# Patient Record
Sex: Female | Born: 1955 | Hispanic: Yes | Marital: Married | Smoking: Current every day smoker
Health system: Southern US, Community
[De-identification: ages and names within clinical notes are randomized; demographics above are authoritative.]

## PROBLEM LIST (undated history)

## (undated) DIAGNOSIS — M1990 Unspecified osteoarthritis, unspecified site: Secondary | ICD-10-CM

## (undated) DIAGNOSIS — K219 Gastro-esophageal reflux disease without esophagitis: Secondary | ICD-10-CM

## (undated) HISTORY — PX: KNEE ARTHROSCOPY: SUR90

## (undated) HISTORY — PX: OTHER SURGICAL HISTORY: SHX169

## (undated) HISTORY — PX: ABDOMINAL HYSTERECTOMY: SHX81

---

## 2008-05-11 ENCOUNTER — Ambulatory Visit: Payer: Self-pay

## 2011-03-28 ENCOUNTER — Emergency Department: Payer: Self-pay

## 2011-03-28 LAB — CBC
HCT: 39 % (ref 35.0–47.0)
HGB: 13.3 g/dL (ref 12.0–16.0)
MCV: 90 fL (ref 80–100)
RBC: 4.32 10*6/uL (ref 3.80–5.20)
WBC: 7.2 10*3/uL (ref 3.6–11.0)

## 2011-03-28 LAB — BASIC METABOLIC PANEL
Anion Gap: 11 (ref 7–16)
BUN: 11 mg/dL (ref 7–18)
Chloride: 105 mmol/L (ref 98–107)
Creatinine: 0.48 mg/dL — ABNORMAL LOW (ref 0.60–1.30)
EGFR (African American): >60
EGFR (Non-African Amer.): >60
Glucose: 100 mg/dL — ABNORMAL HIGH (ref 65–99)
Osmolality: 286 (ref 275–301)
Sodium: 144 mmol/L (ref 136–145)

## 2011-03-28 LAB — TROPONIN I: Troponin-I: <0.02 ng/mL

## 2011-03-29 LAB — TROPONIN I: Troponin-I: <0.02 ng/mL

## 2011-04-09 ENCOUNTER — Emergency Department: Payer: Self-pay | Admitting: Emergency Medicine

## 2011-10-18 ENCOUNTER — Ambulatory Visit: Payer: Self-pay | Admitting: Family Medicine

## 2013-06-15 ENCOUNTER — Ambulatory Visit: Payer: Self-pay | Admitting: Nurse Practitioner

## 2015-01-21 DIAGNOSIS — E669 Obesity, unspecified: Secondary | ICD-10-CM | POA: Insufficient documentation

## 2015-01-21 DIAGNOSIS — F17200 Nicotine dependence, unspecified, uncomplicated: Secondary | ICD-10-CM | POA: Insufficient documentation

## 2015-01-21 DIAGNOSIS — E66812 Obesity, class 2: Secondary | ICD-10-CM | POA: Insufficient documentation

## 2015-01-24 ENCOUNTER — Other Ambulatory Visit: Payer: Self-pay | Admitting: Adult Health

## 2015-01-24 DIAGNOSIS — N644 Mastodynia: Secondary | ICD-10-CM

## 2015-02-10 ENCOUNTER — Other Ambulatory Visit: Payer: Self-pay

## 2015-02-10 ENCOUNTER — Ambulatory Visit: Payer: Self-pay | Attending: Family Medicine

## 2015-05-12 ENCOUNTER — Encounter: Payer: Self-pay | Admitting: *Deleted

## 2015-05-13 ENCOUNTER — Ambulatory Visit: Payer: Managed Care, Other (non HMO) | Admitting: Anesthesiology

## 2015-05-13 ENCOUNTER — Encounter
Admission: RE | Disposition: A | Discharge status: Home Health | Payer: Self-pay | Source: Ambulatory Visit | Attending: Unknown Physician Specialty

## 2015-05-13 ENCOUNTER — Ambulatory Visit
Admission: RE | Admit: 2015-05-13 | Discharge: 2015-05-13 | Disposition: A | Discharge status: Home Health | Payer: Managed Care, Other (non HMO) | Source: Ambulatory Visit | Attending: Unknown Physician Specialty | Admitting: Unknown Physician Specialty

## 2015-05-13 ENCOUNTER — Encounter: Payer: Self-pay | Admitting: Anesthesiology

## 2015-05-13 DIAGNOSIS — K219 Gastro-esophageal reflux disease without esophagitis: Secondary | ICD-10-CM | POA: Diagnosis not present

## 2015-05-13 DIAGNOSIS — K2950 Unspecified chronic gastritis without bleeding: Secondary | ICD-10-CM | POA: Insufficient documentation

## 2015-05-13 DIAGNOSIS — K640 First degree hemorrhoids: Secondary | ICD-10-CM | POA: Insufficient documentation

## 2015-05-13 DIAGNOSIS — F17210 Nicotine dependence, cigarettes, uncomplicated: Secondary | ICD-10-CM | POA: Insufficient documentation

## 2015-05-13 DIAGNOSIS — D122 Benign neoplasm of ascending colon: Secondary | ICD-10-CM | POA: Diagnosis not present

## 2015-05-13 DIAGNOSIS — Z79899 Other long term (current) drug therapy: Secondary | ICD-10-CM | POA: Diagnosis not present

## 2015-05-13 DIAGNOSIS — D127 Benign neoplasm of rectosigmoid junction: Secondary | ICD-10-CM | POA: Diagnosis not present

## 2015-05-13 DIAGNOSIS — K449 Diaphragmatic hernia without obstruction or gangrene: Secondary | ICD-10-CM | POA: Diagnosis not present

## 2015-05-13 DIAGNOSIS — Z1211 Encounter for screening for malignant neoplasm of colon: Secondary | ICD-10-CM | POA: Diagnosis not present

## 2015-05-13 DIAGNOSIS — R1013 Epigastric pain: Secondary | ICD-10-CM | POA: Diagnosis present

## 2015-05-13 HISTORY — PX: COLONOSCOPY WITH PROPOFOL: SHX5780

## 2015-05-13 HISTORY — DX: Gastro-esophageal reflux disease without esophagitis: K21.9

## 2015-05-13 HISTORY — PX: ESOPHAGOGASTRODUODENOSCOPY (EGD) WITH PROPOFOL: SHX5813

## 2015-05-13 SURGERY — COLONOSCOPY WITH PROPOFOL
Anesthesia: General

## 2015-05-13 MED ORDER — IPRATROPIUM-ALBUTEROL 0.5-2.5 (3) MG/3ML IN SOLN
3.0000 mL | Freq: Once | RESPIRATORY_TRACT | Status: AC
  Administered 2015-05-13: 3 mL via RESPIRATORY_TRACT

## 2015-05-13 MED ORDER — GLYCOPYRROLATE 0.2 MG/ML IJ SOLN
INTRAMUSCULAR | Status: DC | PRN
  Administered 2015-05-13: 0.2 mg via INTRAVENOUS

## 2015-05-13 MED ORDER — PROPOFOL 10 MG/ML IV BOLUS
INTRAVENOUS | Status: DC | PRN
  Administered 2015-05-13 (×3): 10 mg via INTRAVENOUS
  Administered 2015-05-13: 50 mg via INTRAVENOUS

## 2015-05-13 MED ORDER — MIDAZOLAM HCL 5 MG/5ML IJ SOLN
INTRAMUSCULAR | Status: DC | PRN
  Administered 2015-05-13: 1 mg via INTRAVENOUS

## 2015-05-13 MED ORDER — IPRATROPIUM-ALBUTEROL 0.5-2.5 (3) MG/3ML IN SOLN
RESPIRATORY_TRACT | Status: AC
  Filled 2015-05-13: qty 3

## 2015-05-13 MED ORDER — PHENYLEPHRINE HCL 10 MG/ML IJ SOLN
INTRAMUSCULAR | Status: DC | PRN
  Administered 2015-05-13 (×3): 200 ug via INTRAVENOUS
  Administered 2015-05-13: 100 ug via INTRAVENOUS

## 2015-05-13 MED ORDER — FENTANYL CITRATE (PF) 100 MCG/2ML IJ SOLN
INTRAMUSCULAR | Status: DC | PRN
  Administered 2015-05-13: 50 ug via INTRAVENOUS

## 2015-05-13 MED ORDER — SODIUM CHLORIDE 0.9 % IV SOLN
INTRAVENOUS | Status: DC
  Administered 2015-05-13: 1000 mL via INTRAVENOUS

## 2015-05-13 MED ORDER — LIDOCAINE HCL (PF) 2 % IJ SOLN
INTRAMUSCULAR | Status: DC | PRN
  Administered 2015-05-13: 60 mg

## 2015-05-13 MED ORDER — PROPOFOL 500 MG/50ML IV EMUL
INTRAVENOUS | Status: DC | PRN
  Administered 2015-05-13: 100 ug/kg/min via INTRAVENOUS

## 2015-05-13 MED ORDER — SODIUM CHLORIDE 0.9 % IV SOLN
INTRAVENOUS | Status: DC
  Administered 2015-05-13: 13:00:00 via INTRAVENOUS

## 2015-05-13 NOTE — Op Note (Signed)
[facility] Gastroenterology Patient Name: Laurie Gonzalez Procedure Date: 05/13/2015 1:12 PM MRN: [id number] Account #: [id number] Date of Birth: April 29, 1955 Admit Type: Outpatient Age: 60 Room: [facility] ENDO ROOM 1 Gender: Female Note Status: Finalized Procedure:            Upper GI endoscopy Indications:          Epigastric abdominal pain Providers:            Manya Silvas, MD Medicines:            Propofol per Anesthesia Complications:        No immediate complications. Procedure:            Pre-Anesthesia Assessment:                       - After reviewing the risks and benefits, the patient                        was deemed in satisfactory condition to undergo the                        procedure.                       After obtaining informed consent, the endoscope was                        passed under direct vision. Throughout the procedure,                        the patient's blood pressure, pulse, and oxygen                        saturations were monitored continuously. The Endoscope                        was introduced through the mouth, and advanced to the                        second part of duodenum. The upper GI endoscopy was                        accomplished without difficulty. The patient tolerated                        the procedure well. Findings:      The examined esophagus was normal. GEJ 37cm.      Localized moderate inflammation characterized by congestion (edema) and       erythema was found on the lesser curvature of the gastric antrum.       Biopsies were taken with a cold forceps for histology. Biopsies were       taken with a cold forceps for Helicobacter pylori testing.      The examined duodenum was normal. Impression:           - Normal esophagus.                       - Gastritis. Biopsied.                       - Normal examined duodenum. Recommendation:       -  Await pathology results. Manya Silvas, MD 05/13/2015  1:25:14 PM This report has been signed electronically. Number of Addenda: 0 Note Initiated On: 05/13/2015 1:12 PM      [facility]

## 2015-05-13 NOTE — Transfer of Care (Signed)
Immediate Anesthesia Transfer of Care Note  Patient: Laurie Gonzalez  Procedure(s) Performed: Procedure(s): COLONOSCOPY WITH PROPOFOL (N/A) ESOPHAGOGASTRODUODENOSCOPY (EGD) WITH PROPOFOL (N/A)  Patient Location: PACU  Anesthesia Type:General  Level of Consciousness: sedated  Airway & Oxygen Therapy: Patient Spontanous Breathing and Patient connected to nasal cannula oxygen  Post-op Assessment: Report given to RN and Post -op Vital signs reviewed and stable  Post vital signs: Reviewed and stable  Last Vitals:  Filed Vitals:   05/13/15 1214  BP: 108/60  Pulse: 92  Temp: 37.1 C  Resp: 16    Complications: No apparent anesthesia complications

## 2015-05-13 NOTE — Anesthesia Postprocedure Evaluation (Signed)
Anesthesia Post Note  Patient: Laurie Gonzalez  Procedure(s) Performed: Procedure(s) (LRB): COLONOSCOPY WITH PROPOFOL (N/A) ESOPHAGOGASTRODUODENOSCOPY (EGD) WITH PROPOFOL (N/A)  Patient location during evaluation: Endoscopy Anesthesia Type: General Level of consciousness: awake and alert Pain management: pain level controlled Vital Signs Assessment: post-procedure vital signs reviewed and stable Respiratory status: spontaneous breathing, nonlabored ventilation, respiratory function stable and patient connected to nasal cannula oxygen Cardiovascular status: blood pressure returned to baseline and stable Postop Assessment: no signs of nausea or vomiting Anesthetic complications: no    Last Vitals:  Filed Vitals:   05/13/15 1416 05/13/15 1426  BP: 96/50 98/55  Pulse: 71 72  Temp:    Resp: 12 10    Last Pain: There were no vitals filed for this visit.               Precious Haws [name]

## 2015-05-13 NOTE — Op Note (Signed)
[facility] Gastroenterology Patient Name: Laurie Gonzalez Procedure Date: 05/13/2015 1:25 PM MRN: [id number] Account #: [id number] Date of Birth: 1956/03/12 Admit Type: Outpatient Age: 60 Room: [facility] ENDO ROOM 1 Gender: Female Note Status: Finalized Procedure:            Colonoscopy Indications:          Screening for colorectal malignant neoplasm Providers:            Manya Silvas, MD Medicines:            Propofol per Anesthesia Complications:        No immediate complications. Procedure:            Pre-Anesthesia Assessment:                       - After reviewing the risks and benefits, the patient                        was deemed in satisfactory condition to undergo the                        procedure.                       After obtaining informed consent, the colonoscope was                        passed under direct vision. Throughout the procedure,                        the patient's blood pressure, pulse, and oxygen                        saturations were monitored continuously. The                        Colonoscope was introduced through the anus and                        advanced to the the cecum, identified by appendiceal                        orifice and ileocecal valve. The colonoscopy was                        performed without difficulty. The patient tolerated the                        procedure well. The quality of the bowel preparation                        was excellent. Findings:      A diminutive polyp was found in the ascending colon. The polyp was       sessile. The polyp was removed with a jumbo cold forceps. Resection and       retrieval were complete.      Two sessile polyps were found in the recto-sigmoid colon. The polyps       were diminutive in size. These polyps were removed with a cold biopsy       forceps. Resection and retrieval were complete.      Internal hemorrhoids  were found during endoscopy. The hemorrhoids were        small and Grade I (internal hemorrhoids that do not prolapse).      The exam was otherwise without abnormality. Impression:           - One diminutive polyp in the ascending colon, removed                        with a jumbo cold forceps. Resected and retrieved.                       - Two diminutive polyps at the recto-sigmoid colon,                        removed with a cold biopsy forceps. Resected and                        retrieved.                       - Internal hemorrhoids.                       - The examination was otherwise normal. Recommendation:       - Await pathology results. Manya Silvas, MD 05/13/2015 1:44:58 PM This report has been signed electronically. Number of Addenda: 0 Note Initiated On: 05/13/2015 1:25 PM Scope Withdrawal Time: 0 hours 11 minutes 42 seconds  Total Procedure Duration: 0 hours 16 minutes 48 seconds       [facility]

## 2015-05-13 NOTE — Anesthesia Preprocedure Evaluation (Signed)
Anesthesia Evaluation  Patient identified by MRN, date of birth, ID band Patient awake    Reviewed: Allergy & Precautions, H&P , NPO status , Patient's Chart, lab work & pertinent test results  History of Anesthesia Complications Negative for: history of anesthetic complications  Airway Mallampati: III  TM Distance: >3 FB Neck ROM: limited    Dental  (+) Poor Dentition, Chipped, Caps   Pulmonary neg shortness of breath, Current Smoker,    Pulmonary exam normal breath sounds clear to auscultation       Cardiovascular Exercise Tolerance: Good (-) angina(-) Past MI and (-) DOE negative cardio ROS Normal cardiovascular exam Rhythm:regular Rate:Normal     Neuro/Psych negative neurological ROS  negative psych ROS   GI/Hepatic Neg liver ROS, GERD  Controlled,  Endo/Other  negative endocrine ROS  Renal/GU negative Renal ROS  negative genitourinary   Musculoskeletal   Abdominal   Peds  Hematology negative hematology ROS (+)   Anesthesia Other Findings Past Medical History:   GERD (gastroesophageal reflux disease)                      Past Surgical History:   ABDOMINAL HYSTERECTOMY                                        KNEE ARTHROSCOPY                                             BMI    Body Mass Index   31.61 kg/m 2      Reproductive/Obstetrics negative OB ROS                             Anesthesia Physical Anesthesia Plan  ASA: III  Anesthesia Plan: General   Post-op Pain Management:    Induction:   Airway Management Planned:   Additional Equipment:   Intra-op Plan:   Post-operative Plan:   Informed Consent: I have reviewed the patients History and Physical, chart, labs and discussed the procedure including the risks, benefits and alternatives for the proposed anesthesia with the patient or authorized representative who has indicated his/her understanding and acceptance.    Dental Advisory Given  Plan Discussed with: Anesthesiologist, CRNA and Surgeon  Anesthesia Plan Comments:         Anesthesia Quick Evaluation

## 2015-05-13 NOTE — H&P (Signed)
   Primary Care Physician:  Pcp Not In System Primary Gastroenterologist:  Dr. Vira Agar  Pre-Procedure History & Physical: HPI:  Laurie Gonzalez is a 60 y.o. female is here for an endoscopy and colonoscopy.   Past Medical History  Diagnosis Date  . GERD (gastroesophageal reflux disease)     Past Surgical History  Procedure Laterality Date  . Abdominal hysterectomy    . Knee arthroscopy      Prior to Admission medications   Medication Sig Start Date End Date Taking? Authorizing Provider  bismuth subsalicylate (PEPTO BISMOL) 262 MG chewable tablet Chew 524 mg by mouth as needed.   Yes Historical Provider, MD  pantoprazole (PROTONIX) 40 MG tablet Take 40 mg by mouth daily.   Yes Historical Provider, MD    Allergies as of 05/12/2015  . (Not on File)    History reviewed. No pertinent family history.  Social History   Social History  . Marital Status: Single    Spouse Name: N/A  . Number of Children: N/A  . Years of Education: N/A   Occupational History  . Not on file.   Social History Main Topics  . Smoking status: Current Every Day Smoker -- 0.25 packs/day    Types: Cigarettes  . Smokeless tobacco: Never Used  . Alcohol Use: Yes  . Drug Use: No  . Sexual Activity: Not on file   Other Topics Concern  . Not on file   Social History Narrative    Review of Systems: See HPI, otherwise negative ROS  Physical Exam: BP 108/60 mmHg  Pulse 92  Temp(Src) 98.8 F (37.1 C) (Tympanic)  Resp 16  Ht 5\' 5"  (1.651 m)  Wt 86.183 kg (190 lb)  BMI 31.62 kg/m2  SpO2 96% General:   Alert,  pleasant and cooperative in NAD Head:  Normocephalic and atraumatic. Neck:  Supple; no masses or thyromegaly. Lungs:  Clear throughout to auscultation.    Heart:  Regular rate and rhythm. Abdomen:  Soft, nontender and nondistended. Normal bowel sounds, without guarding, and without rebound.   Neurologic:  Alert and  oriented x4;  grossly normal neurologically.  Impression/Plan: Laurie Gonzalez is here for an endoscopy and colonoscopy to be performed for screening colon exam and epigastric pain and GERD.  Risks, benefits, limitations, and alternatives regarding  endoscopy and colonoscopy have been reviewed with the patient.  Questions have been answered.  All parties agreeable.   Gaylyn Cheers, MD  05/13/2015, 12:25 PM

## 2015-05-13 NOTE — OR Nursing (Signed)
All Pre-op done with assistance of [facility] interpreter, Lacretia Leigh.  Norlene Campbell, RN

## 2015-05-16 ENCOUNTER — Encounter: Payer: Self-pay | Admitting: Unknown Physician Specialty

## 2015-05-17 LAB — SURGICAL PATHOLOGY

## 2016-09-19 ENCOUNTER — Other Ambulatory Visit: Payer: Self-pay | Admitting: Family Medicine

## 2016-09-19 DIAGNOSIS — M25561 Pain in right knee: Secondary | ICD-10-CM

## 2016-09-20 ENCOUNTER — Ambulatory Visit
Admission: RE | Admit: 2016-09-20 | Discharge: 2016-09-20 | Disposition: A | Discharge status: Home / Self Care | Payer: Commercial Managed Care - PPO | Source: Ambulatory Visit | Attending: Family Medicine | Admitting: Family Medicine

## 2016-09-20 DIAGNOSIS — M7051 Other bursitis of knee, right knee: Secondary | ICD-10-CM | POA: Insufficient documentation

## 2016-09-20 DIAGNOSIS — M25561 Pain in right knee: Secondary | ICD-10-CM

## 2016-09-20 DIAGNOSIS — M7121 Synovial cyst of popliteal space [Baker], right knee: Secondary | ICD-10-CM | POA: Diagnosis not present

## 2016-09-20 DIAGNOSIS — M25461 Effusion, right knee: Secondary | ICD-10-CM | POA: Insufficient documentation

## 2016-09-20 DIAGNOSIS — X58XXXA Exposure to other specified factors, initial encounter: Secondary | ICD-10-CM | POA: Diagnosis not present

## 2016-09-20 DIAGNOSIS — S83231A Complex tear of medial meniscus, current injury, right knee, initial encounter: Secondary | ICD-10-CM | POA: Insufficient documentation

## 2016-09-20 DIAGNOSIS — S83241A Other tear of medial meniscus, current injury, right knee, initial encounter: Secondary | ICD-10-CM | POA: Insufficient documentation

## 2016-09-20 DIAGNOSIS — M1711 Unilateral primary osteoarthritis, right knee: Secondary | ICD-10-CM | POA: Insufficient documentation

## 2017-05-07 ENCOUNTER — Other Ambulatory Visit: Payer: Self-pay | Admitting: Internal Medicine

## 2017-05-07 DIAGNOSIS — G8929 Other chronic pain: Secondary | ICD-10-CM

## 2017-05-07 DIAGNOSIS — Z1239 Encounter for other screening for malignant neoplasm of breast: Secondary | ICD-10-CM

## 2017-05-07 DIAGNOSIS — M5442 Lumbago with sciatica, left side: Principal | ICD-10-CM

## 2017-06-07 ENCOUNTER — Ambulatory Visit
Admission: RE | Admit: 2017-06-07 | Discharge: 2017-06-07 | Disposition: A | Discharge status: Home / Self Care | Payer: Commercial Managed Care - PPO | Source: Ambulatory Visit | Attending: Internal Medicine | Admitting: Internal Medicine

## 2017-06-07 DIAGNOSIS — M5442 Lumbago with sciatica, left side: Secondary | ICD-10-CM | POA: Diagnosis present

## 2017-06-07 DIAGNOSIS — M48061 Spinal stenosis, lumbar region without neurogenic claudication: Secondary | ICD-10-CM | POA: Insufficient documentation

## 2017-06-07 DIAGNOSIS — M5116 Intervertebral disc disorders with radiculopathy, lumbar region: Secondary | ICD-10-CM | POA: Insufficient documentation

## 2017-06-07 DIAGNOSIS — G8929 Other chronic pain: Secondary | ICD-10-CM | POA: Diagnosis not present

## 2017-06-07 MED ORDER — GADOBENATE DIMEGLUMINE 529 MG/ML IV SOLN
20.0000 mL | Freq: Once | INTRAVENOUS | Status: AC | PRN
  Administered 2017-06-07: 18 mL via INTRAVENOUS

## 2017-06-25 ENCOUNTER — Encounter: Payer: Self-pay | Admitting: *Deleted

## 2017-06-25 ENCOUNTER — Other Ambulatory Visit: Payer: Self-pay

## 2017-06-26 ENCOUNTER — Ambulatory Visit: Payer: Commercial Managed Care - PPO | Admitting: Anesthesiology

## 2017-06-26 ENCOUNTER — Encounter
Admission: RE | Disposition: A | Discharge status: Home / Self Care | Payer: Self-pay | Source: Ambulatory Visit | Attending: Surgery

## 2017-06-26 ENCOUNTER — Ambulatory Visit
Admission: RE | Admit: 2017-06-26 | Discharge: 2017-06-26 | Disposition: A | Discharge status: Home / Self Care | Payer: Commercial Managed Care - PPO | Source: Ambulatory Visit | Attending: Surgery | Admitting: Surgery

## 2017-06-26 DIAGNOSIS — M23221 Derangement of posterior horn of medial meniscus due to old tear or injury, right knee: Secondary | ICD-10-CM | POA: Insufficient documentation

## 2017-06-26 DIAGNOSIS — Z801 Family history of malignant neoplasm of trachea, bronchus and lung: Secondary | ICD-10-CM | POA: Insufficient documentation

## 2017-06-26 DIAGNOSIS — Z800 Family history of malignant neoplasm of digestive organs: Secondary | ICD-10-CM | POA: Insufficient documentation

## 2017-06-26 DIAGNOSIS — M1711 Unilateral primary osteoarthritis, right knee: Secondary | ICD-10-CM | POA: Diagnosis not present

## 2017-06-26 DIAGNOSIS — Z833 Family history of diabetes mellitus: Secondary | ICD-10-CM | POA: Diagnosis not present

## 2017-06-26 DIAGNOSIS — F17200 Nicotine dependence, unspecified, uncomplicated: Secondary | ICD-10-CM | POA: Diagnosis not present

## 2017-06-26 DIAGNOSIS — S83241A Other tear of medial meniscus, current injury, right knee, initial encounter: Secondary | ICD-10-CM | POA: Diagnosis present

## 2017-06-26 DIAGNOSIS — Z79899 Other long term (current) drug therapy: Secondary | ICD-10-CM | POA: Diagnosis not present

## 2017-06-26 DIAGNOSIS — Z90710 Acquired absence of both cervix and uterus: Secondary | ICD-10-CM | POA: Diagnosis not present

## 2017-06-26 DIAGNOSIS — M23261 Derangement of other lateral meniscus due to old tear or injury, right knee: Secondary | ICD-10-CM | POA: Diagnosis not present

## 2017-06-26 HISTORY — DX: Unspecified osteoarthritis, unspecified site: M19.90

## 2017-06-26 HISTORY — PX: KNEE ARTHROSCOPY: SHX127

## 2017-06-26 SURGERY — ARTHROSCOPY, KNEE
Anesthesia: General | Site: Knee | Laterality: Right | Wound class: Clean

## 2017-06-26 MED ORDER — PROPOFOL 10 MG/ML IV BOLUS
INTRAVENOUS | Status: DC | PRN
  Administered 2017-06-26: 130 mg via INTRAVENOUS

## 2017-06-26 MED ORDER — OXYCODONE HCL 5 MG PO TABS
5.0000 mg | ORAL_TABLET | Freq: Once | ORAL | Status: AC | PRN
  Administered 2017-06-26: 5 mg via ORAL

## 2017-06-26 MED ORDER — IBUPROFEN 100 MG/5ML PO SUSP: 800.0000 mg | Freq: Once | ORAL | Status: DC | PRN

## 2017-06-26 MED ORDER — LIDOCAINE HCL (CARDIAC) PF 100 MG/5ML IV SOSY
PREFILLED_SYRINGE | INTRAVENOUS | Status: DC | PRN
  Administered 2017-06-26: 40 mg via INTRATRACHEAL

## 2017-06-26 MED ORDER — LIDOCAINE-EPINEPHRINE 1 %-1:100000 IJ SOLN
INTRAMUSCULAR | Status: DC | PRN
  Administered 2017-06-26 (×2): 20 mL

## 2017-06-26 MED ORDER — GLYCOPYRROLATE 0.2 MG/ML IJ SOLN
INTRAMUSCULAR | Status: DC | PRN
  Administered 2017-06-26: .1 mg via INTRAVENOUS

## 2017-06-26 MED ORDER — MIDAZOLAM HCL 5 MG/5ML IJ SOLN
INTRAMUSCULAR | Status: DC | PRN
  Administered 2017-06-26: 2 mg via INTRAVENOUS

## 2017-06-26 MED ORDER — DEXAMETHASONE SODIUM PHOSPHATE 4 MG/ML IJ SOLN
INTRAMUSCULAR | Status: DC | PRN
  Administered 2017-06-26: 4 mg via INTRAVENOUS

## 2017-06-26 MED ORDER — BUPIVACAINE HCL (PF) 0.5 % IJ SOLN
INTRAMUSCULAR | Status: DC | PRN
  Administered 2017-06-26: 20 mL

## 2017-06-26 MED ORDER — FENTANYL CITRATE (PF) 100 MCG/2ML IJ SOLN: 25.0000 ug | INTRAMUSCULAR | Status: DC | PRN

## 2017-06-26 MED ORDER — FENTANYL CITRATE (PF) 100 MCG/2ML IJ SOLN
INTRAMUSCULAR | Status: DC | PRN
  Administered 2017-06-26: 50 ug via INTRAVENOUS
  Administered 2017-06-26 (×2): 25 ug via INTRAVENOUS

## 2017-06-26 MED ORDER — HYDROCODONE-ACETAMINOPHEN 5-325 MG PO TABS: 1.0000 | ORAL_TABLET | Freq: Four times a day (QID) | ORAL | 0 refills | Status: DC | PRN

## 2017-06-26 MED ORDER — CEFAZOLIN SODIUM-DEXTROSE 2-4 GM/100ML-% IV SOLN
2.0000 g | Freq: Once | INTRAVENOUS | Status: AC
  Administered 2017-06-26: 2 g via INTRAVENOUS

## 2017-06-26 MED ORDER — LACTATED RINGERS IV SOLN
INTRAVENOUS | Status: DC | PRN
  Administered 2017-06-26: 12:00:00 via INTRAVENOUS

## 2017-06-26 MED ORDER — OXYCODONE HCL 5 MG/5ML PO SOLN: 5.0000 mg | Freq: Once | ORAL | Status: AC | PRN

## 2017-06-26 MED ORDER — ONDANSETRON HCL 4 MG/2ML IJ SOLN
INTRAMUSCULAR | Status: DC | PRN
  Administered 2017-06-26: 4 mg via INTRAVENOUS

## 2017-06-26 SURGICAL SUPPLY — 23 items
BANDAGE ELASTIC 6 LF NS (GAUZE/BANDAGES/DRESSINGS) ×3 IMPLANT
BLADE FULL RADIUS 3.5 (BLADE) ×3 IMPLANT
CHLORAPREP W/TINT 26ML (MISCELLANEOUS) ×3 IMPLANT
COVER LIGHT HANDLE UNIVERSAL (MISCELLANEOUS) ×6 IMPLANT
CUFF TOURN SGL QUICK 30 (MISCELLANEOUS) ×2
CUFF TRNQT CYL LO 30X4X (MISCELLANEOUS) ×1 IMPLANT
DRAPE IMP U-DRAPE 54X76 (DRAPES) ×3 IMPLANT
GAUZE SPONGE 4X4 12PLY STRL (GAUZE/BANDAGES/DRESSINGS) ×3 IMPLANT
GLOVE BIO SURGEON STRL SZ8 (GLOVE) ×6 IMPLANT
GLOVE INDICATOR 8.0 STRL GRN (GLOVE) ×3 IMPLANT
GOWN STRL REUS W/ TWL LRG LVL3 (GOWN DISPOSABLE) ×1 IMPLANT
GOWN STRL REUS W/ TWL XL LVL3 (GOWN DISPOSABLE) ×1 IMPLANT
GOWN STRL REUS W/TWL LRG LVL3 (GOWN DISPOSABLE) ×2
GOWN STRL REUS W/TWL XL LVL3 (GOWN DISPOSABLE) ×2
IV LACTATED RINGER IRRG 3000ML (IV SOLUTION) ×4
IV LR IRRIG 3000ML ARTHROMATIC (IV SOLUTION) ×2 IMPLANT
KIT TURNOVER KIT A (KITS) ×3 IMPLANT
MANIFOLD 4PT FOR NEPTUNE1 (MISCELLANEOUS) ×3 IMPLANT
NEEDLE HYPO 21X1.5 SAFETY (NEEDLE) ×6 IMPLANT
PACK ARTHROSCOPY KNEE (MISCELLANEOUS) ×3 IMPLANT
SUT PROLENE 4 0 PS 2 18 (SUTURE) ×3 IMPLANT
SYR 50ML LL SCALE MARK (SYRINGE) ×3 IMPLANT
TUBING ARTHRO INFLOW-ONLY STRL (TUBING) ×3 IMPLANT

## 2017-06-26 NOTE — Anesthesia Preprocedure Evaluation (Signed)
Anesthesia Evaluation  Patient identified by MRN, date of birth, ID band Patient awake    Reviewed: Allergy & Precautions, H&P , NPO status , Patient's Chart, lab work & pertinent test results  History of Anesthesia Complications Negative for: history of anesthetic complications  Airway Mallampati: II  TM Distance: >3 FB Neck ROM: full    Dental no notable dental hx.    Pulmonary Current Smoker,    Pulmonary exam normal        Cardiovascular negative cardio ROS Normal cardiovascular exam     Neuro/Psych    GI/Hepatic Neg liver ROS, Medicated,  Endo/Other  negative endocrine ROS  Renal/GU negative Renal ROS     Musculoskeletal   Abdominal   Peds  Hematology negative hematology ROS (+)   Anesthesia Other Findings   Reproductive/Obstetrics                             Anesthesia Physical Anesthesia Plan  ASA: I  Anesthesia Plan: General LMA   Post-op Pain Management:    Induction:   PONV Risk Score and Plan:   Airway Management Planned:   Additional Equipment:   Intra-op Plan:   Post-operative Plan:   Informed Consent: I have reviewed the patients History and Physical, chart, labs and discussed the procedure including the risks, benefits and alternatives for the proposed anesthesia with the patient or authorized representative who has indicated his/her understanding and acceptance.     Plan Discussed with:   Anesthesia Plan Comments:         Anesthesia Quick Evaluation

## 2017-06-26 NOTE — Transfer of Care (Signed)
Immediate Anesthesia Transfer of Care Note  Patient: Laurie Gonzalez  Procedure(s) Performed: ARTHROSCOPY KNEE WITH DEBRIDEMENT AND REPAIR VS PARTIAL MEDIAL MENISCECTOMY (Right Knee)  Patient Location: PACU  Anesthesia Type: General LMA  Level of Consciousness: awake, alert  and patient cooperative  Airway and Oxygen Therapy: Patient Spontanous Breathing and Patient connected to supplemental oxygen  Post-op Assessment: Post-op Vital signs reviewed, Patient's Cardiovascular Status Stable, Respiratory Function Stable, Patent Airway and No signs of Nausea or vomiting  Post-op Vital Signs: Reviewed and stable  Complications: No apparent anesthesia complications

## 2017-06-26 NOTE — H&P (Signed)
Paper H&P to be scanned into permanent record. H&P reviewed and patient re-examined. No changes. 

## 2017-06-26 NOTE — Op Note (Signed)
06/26/2017  12:51 PM  Patient:   Laurie Gonzalez  Pre-Op Diagnosis:   Medial meniscus tear with underlying degenerative joint disease, right knee.  Postoperative diagnosis:   Medial and lateral meniscal tears with underlying degenerative joint disease, right knee.  Procedure:   Arthroscopic debridement with partial medial and lateral meniscectomies, right knee.  Surgeon:   Pascal Lux, M.D.  Anesthesia:   General LMA.  Findings:   As above. There was a complex tear with an unstable flap component involving the posterior medial portion of the medial meniscus, as well as a small horizontal cleavage tear involving the midportion of the lateral meniscus, again with a small unstable inferior flap tear. There were focal grade 3 chondromalacial changes involving the medial edge of the medial femoral condyle as well as the medial margin of the medial tibial plateau. The remainder of the articular surfaces were in satisfactory condition. The anterior and posterior crucial ligaments both were in satisfactory condition as well.  Complications:   None.  EBL:   <5 cc.  Total fluids:   700 cc of crystalloid.  Tourniquet time:   None  Drains:   None  Closure:   4-0 Prolene interrupted sutures.  Brief clinical note:   The patient is a 62 year old female with a nearly 1 year history of medial sided right knee pain. The patient's symptoms have persisted despite medications, activity modification, and injection, etc. Her history and examination are consistent with a medial meniscus tear which was confirmed by MRI scan. The patient presents at this time for arthroscopy, debridement, and partial medial meniscectomy.  Procedure:   The patient was brought into the operating room and lain in the supine position. After adequate general laryngeal mask anesthesia was obtained, a timeout was performed to verify the appropriate side. The patient's right knee was injected sterilely using a solution of 30 cc of 1%  lidocaine and 30 cc of 0.5% Sensorcaine with epinephrine. The right lower extremity was prepped with ChloraPrep solution before being draped sterilely. Preoperative antibiotics were administered. The expected portal sites were injected with 0.5% Sensorcaine with epinephrine before the camera was placed in the anterolateral portal and instrumentation performed through the anteromedial portal. The knee was sequentially examined beginning in the suprapatellar pouch, then progressing to the patellofemoral space, the medial gutter compartment, the notch, and finally the lateral compartment and gutter. The findings were as described above. Abundant reactive synovial tissues anteriorly were debrided using the full-radius resector in order to improve visualization. The complex degenerative tear involving the anterior and posterior medial aspects of the medial meniscus were debrided back to stable margins using combination of the straight and angled any monitors, as well as the full-radius resector. The camera was repositioned in the anteromedial portal and the instruments inserted through the anterolateral portal in order to better access the anterior most portion of the tear. The lateral meniscus tear was debrided back to stable margins using combination of the straight baskets and full-radius resector. Subsequent probing of the remaining medial and lateral meniscal rims demonstrated excellent stability. The instruments were removed from the joint after suctioning the excess fluid. The portal sites were closed using 4-0 Prolene interrupted sutures before a sterile bulky dressing was applied to the knee. The patient was then awakened, extubated, and returned to the recovery room in satisfactory condition after tolerating the procedure well.

## 2017-06-26 NOTE — Discharge Instructions (Signed)
General Anesthesia, Adult, Care After °These instructions provide you with information about caring for yourself after your procedure. Your health care provider may also give you more specific instructions. Your treatment has been planned according to current medical practices, but problems sometimes occur. Call your health care provider if you have any problems or questions after your procedure. °What can I expect after the procedure? °After the procedure, it is common to have: °· Vomiting. °· A sore throat. °· Mental slowness. ° °It is common to feel: °· Nauseous. °· Cold or shivery. °· Sleepy. °· Tired. °· Sore or achy, even in parts of your body where you did not have surgery. ° °Follow these instructions at home: °For at least 24 hours after the procedure: °· Do not: °? Participate in activities where you could fall or become injured. °? Drive. °? Use heavy machinery. °? Drink alcohol. °? Take sleeping pills or medicines that cause drowsiness. °? Make important decisions or sign legal documents. °? Take care of children on your own. °· Rest. °Eating and drinking °· If you vomit, drink water, juice, or soup when you can drink without vomiting. °· Drink enough fluid to keep your urine clear or pale yellow. °· Make sure you have little or no nausea before eating solid foods. °· Follow the diet recommended by your health care provider. °General instructions °· Have a responsible adult stay with you until you are awake and alert. °· Return to your normal activities as told by your health care provider. Ask your health care provider what activities are safe for you. °· Take over-the-counter and prescription medicines only as told by your health care provider. °· If you smoke, do not smoke without supervision. °· Keep all follow-up visits as told by your health care provider. This is important. °Contact a health care provider if: °· You continue to have nausea or vomiting at home, and medicines are not helpful. °· You  cannot drink fluids or start eating again. °· You cannot urinate after 8-12 hours. °· You develop a skin rash. °· You have fever. °· You have increasing redness at the site of your procedure. °Get help right away if: °· You have difficulty breathing. °· You have chest pain. °· You have unexpected bleeding. °· You feel that you are having a life-threatening or urgent problem. °This information is not intended to replace advice given to you by your health care provider. Make sure you discuss any questions you have with your health care provider. °Document Released: 06/04/2000 Document Revised: 08/01/2015 Document Reviewed: 02/10/2015 °Elsevier Interactive Patient Education © 2018 [employer] ° ° °Orthopedic discharge instructions: °Keep dressing dry and intact.  °May shower after dressing changed on post-op day #4 (Sunday).  °Cover sutures with Band-Aids after drying off. °Apply ice frequently to knee. °Take ibuprofen 600-800 mg TID with meals for 7-10 days, then as necessary. °Take pain medication as prescribed or ES Tylenol when needed.  °May weight-bear as tolerated - use crutches or walker as needed. °Follow-up in 10-14 days or as scheduled. °

## 2017-06-26 NOTE — Anesthesia Procedure Notes (Signed)
Procedure Name: LMA Insertion Date/Time: 06/26/2017 11:56 AM Performed by: Mayme Genta, CRNA Pre-anesthesia Checklist: Patient identified, Emergency Drugs available, Suction available, Timeout performed and Patient being monitored Patient Re-evaluated:Patient Re-evaluated prior to induction Oxygen Delivery Method: Circle system utilized Preoxygenation: Pre-oxygenation with 100% oxygen Induction Type: IV induction LMA: LMA inserted LMA Size: 4.0 Number of attempts: 1 Placement Confirmation: positive ETCO2 and breath sounds checked- equal and bilateral Tube secured with: Tape

## 2017-06-26 NOTE — Anesthesia Postprocedure Evaluation (Signed)
Anesthesia Post Note  Patient: Laurie Gonzalez  Procedure(s) Performed: ARTHROSCOPY KNEE WITH DEBRIDEMENT AND REPAIR VS PARTIAL MEDIAL MENISCECTOMY (Right Knee)  Patient location during evaluation: PACU Anesthesia Type: General Level of consciousness: awake and alert Pain management: pain level controlled Vital Signs Assessment: post-procedure vital signs reviewed and stable Respiratory status: spontaneous breathing Cardiovascular status: blood pressure returned to baseline Postop Assessment: no headache Anesthetic complications: no    Jaci Standard, III,  Cionna Collantes D

## 2017-06-27 ENCOUNTER — Encounter: Payer: Self-pay | Admitting: Surgery

## 2017-06-27 DIAGNOSIS — S83271A Complex tear of lateral meniscus, current injury, right knee, initial encounter: Secondary | ICD-10-CM | POA: Insufficient documentation

## 2017-07-10 ENCOUNTER — Ambulatory Visit (HOSPITAL_BASED_OUTPATIENT_CLINIC_OR_DEPARTMENT_OTHER): Payer: Commercial Managed Care - PPO | Admitting: Student in an Organized Health Care Education/Training Program

## 2017-07-10 ENCOUNTER — Ambulatory Visit
Admission: RE | Admit: 2017-07-10 | Discharge: 2017-07-10 | Disposition: A | Discharge status: Home / Self Care | Payer: Commercial Managed Care - PPO | Source: Ambulatory Visit | Attending: Student in an Organized Health Care Education/Training Program | Admitting: Student in an Organized Health Care Education/Training Program

## 2017-07-10 ENCOUNTER — Other Ambulatory Visit: Payer: Self-pay

## 2017-07-10 ENCOUNTER — Encounter: Payer: Self-pay | Admitting: Student in an Organized Health Care Education/Training Program

## 2017-07-10 VITALS — BP 122/60 | HR 84 | Temp 97.8°F | Resp 19 | Ht 62.0 in | Wt 185.0 lb

## 2017-07-10 DIAGNOSIS — M5116 Intervertebral disc disorders with radiculopathy, lumbar region: Secondary | ICD-10-CM | POA: Diagnosis present

## 2017-07-10 DIAGNOSIS — M48061 Spinal stenosis, lumbar region without neurogenic claudication: Secondary | ICD-10-CM | POA: Diagnosis not present

## 2017-07-10 DIAGNOSIS — F17210 Nicotine dependence, cigarettes, uncomplicated: Secondary | ICD-10-CM | POA: Diagnosis not present

## 2017-07-10 DIAGNOSIS — K219 Gastro-esophageal reflux disease without esophagitis: Secondary | ICD-10-CM | POA: Insufficient documentation

## 2017-07-10 DIAGNOSIS — M1990 Unspecified osteoarthritis, unspecified site: Secondary | ICD-10-CM | POA: Insufficient documentation

## 2017-07-10 DIAGNOSIS — M5416 Radiculopathy, lumbar region: Secondary | ICD-10-CM

## 2017-07-10 MED ORDER — LIDOCAINE HCL (PF) 1 % IJ SOLN
4.5000 mL | Freq: Once | INTRAMUSCULAR | Status: AC
  Administered 2017-07-10: 4.5 mL

## 2017-07-10 MED ORDER — LIDOCAINE HCL (PF) 1 % IJ SOLN
INTRAMUSCULAR | Status: AC
  Filled 2017-07-10: qty 5

## 2017-07-10 MED ORDER — FENTANYL CITRATE (PF) 100 MCG/2ML IJ SOLN
25.0000 ug | INTRAMUSCULAR | Status: DC | PRN
  Administered 2017-07-10: 100 ug via INTRAVENOUS

## 2017-07-10 MED ORDER — SODIUM CHLORIDE 0.9 % IJ SOLN
INTRAMUSCULAR | Status: AC
  Filled 2017-07-10: qty 10

## 2017-07-10 MED ORDER — FENTANYL CITRATE (PF) 100 MCG/2ML IJ SOLN
INTRAMUSCULAR | Status: AC
  Filled 2017-07-10: qty 2

## 2017-07-10 MED ORDER — SODIUM CHLORIDE 0.9% FLUSH
2.0000 mL | Freq: Once | INTRAVENOUS | Status: AC
  Administered 2017-07-10: 10 mL

## 2017-07-10 MED ORDER — IOPAMIDOL (ISOVUE-M 200) INJECTION 41%
10.0000 mL | Freq: Once | INTRAMUSCULAR | Status: AC
  Administered 2017-07-10: 10 mL via EPIDURAL
  Filled 2017-07-10: qty 10

## 2017-07-10 MED ORDER — ROPIVACAINE HCL 2 MG/ML IJ SOLN
2.0000 mL | Freq: Once | INTRAMUSCULAR | Status: AC
  Administered 2017-07-10: 10 mL via EPIDURAL
  Filled 2017-07-10: qty 10

## 2017-07-10 MED ORDER — LACTATED RINGERS IV SOLN
1000.0000 mL | Freq: Once | INTRAVENOUS | Status: AC
  Administered 2017-07-10: 1000 mL via INTRAVENOUS

## 2017-07-10 MED ORDER — ROPIVACAINE HCL 2 MG/ML IJ SOLN
INTRAMUSCULAR | Status: AC
  Filled 2017-07-10: qty 10

## 2017-07-10 MED ORDER — DEXAMETHASONE SODIUM PHOSPHATE 10 MG/ML IJ SOLN
10.0000 mg | Freq: Once | INTRAMUSCULAR | Status: AC
  Administered 2017-07-10: 10 mg
  Filled 2017-07-10: qty 1

## 2017-07-10 NOTE — Progress Notes (Signed)
Safety precautions to be maintained throughout the outpatient stay will include: orient to surroundings, keep bed in low position, maintain call bell within reach at all times, provide assistance with transfer out of bed and ambulation.  

## 2017-07-10 NOTE — Progress Notes (Signed)
Patient's Name: Laurie Gonzalez  MRN: [id number]  Referring Provider: Meade Maw, MD  DOB: 11-12-1955  PCP: Tracie Harrier, MD  DOS: 07/10/2017  Note by: Gillis Santa, MD  Service setting: Ambulatory outpatient  Specialty: Interventional Pain Management  Location: [facility] (AMB) Pain Management Facility    Patient type: New patient ("FAST-TRACK" Evaluation)   Warning: This referral option does not include the extensive pharmacological evaluation required for [place] to take over the patient's medication management. The "Fast-Track" system is designed to bypass the new patient referral waiting list, as well as the normal patient evaluation process, in order to provide a patient in distress with a timely pain management intervention. Because the system was not designed to unfairly get a patient into our pain practice ahead of those already waiting, certain restrictions apply. By requesting a "Fast-Track" consult, the referring physician has opted to continue managing the patient's medications in order to get interventional urgent care.  Primary Reason for Visit: Interventional Pain Management Treatment. CC: Back Pain (low) and Leg Pain (left )   Procedure  HPI  Laurie Gonzalez is a 62 y.o. year old, female patient, who comes today for a  "Fast-Track" new patient evaluation, as requested by Meade Maw, MD. The patient has been made aware that this type of referral option is reserved for the Interventional Pain Management portion of our practice and completely excludes the option of medication management. Her primarily concern today is the Back Pain (low) and Leg Pain (left )  Pain Assessment: Location: Lower Back Radiating: legs Onset: More than a month ago Duration: Chronic pain Quality: Aching("pulling") Severity: 4 /10 (self-reported pain score)  Note: Reported level is compatible with observation.                         When using our objective Pain Scale, levels between 6 and 10/10 are said  to belong in an emergency room, as it progressively worsens from a 6/10, described as severely limiting, requiring emergency care not usually available at an outpatient pain management facility. At a 6/10 level, communication becomes difficult and requires great effort. Assistance to reach the emergency department may be required. Facial flushing and profuse sweating along with potentially dangerous increases in heart rate and blood pressure will be evident. Effect on ADL:   Timing: Constant Modifying factors: medication, positioning  Onset and Duration: Present longer than 3 months Cause of pain: Unknown Severity: No change since onset Timing: Not influenced by the time of the day and After activity or exercise Aggravating Factors: Prolonged sitting, Prolonged standing and Walking Alleviating Factors: Lying down, Medications and Resting Associated Problems: N/A Quality of Pain: Aching, Cramping, Sharp, Tender and Uncomfortable Previous Examinations or Tests: Previous Treatments:  The patient comes into the clinics today, referred to [place] for a lumbar epidural steroid injection by Dr. Cari Caraway with neurosurgery.  In brief, patient endorses axial low back pain that radiates down her left leg along her lateral aspect of her thigh and on the back of her ankle.  Patient denies any bowel or bladder issues.  Meds   Current Outpatient Medications:  .  HYDROcodone-acetaminophen (NORCO/VICODIN) 5-325 MG tablet, Take 1-2 tablets by mouth every 6 (six) hours as needed for moderate pain., Disp: 30 tablet, Rfl: 0 .  bismuth subsalicylate (PEPTO BISMOL) 262 MG chewable tablet, Chew 524 mg by mouth as needed., Disp: , Rfl:  .  pantoprazole (PROTONIX) 40 MG tablet, Take 40 mg by mouth daily., Disp: , Rfl:  Current Facility-Administered Medications:  .  fentaNYL (SUBLIMAZE) injection 25-100 mcg, 25-100 mcg, Intravenous, Q5 min PRN, Holley Raring, Bilal, MD, 100 mcg at 07/10/17 1215 .  lactated ringers infusion  1,000 mL, 1,000 mL, Intravenous, Once, Lateef, Bilal, MD, Last Rate: 125 mL/hr at 07/10/17 1206, 1,000 mL at 07/10/17 1206  Imaging Review  Lumbar MR w/wo contrast:  Results for orders placed during the hospital encounter of 06/07/17  MR Lumbar Spine W Wo Contrast   Narrative CLINICAL DATA:  The left-sided low back pain extending into the left lower extremity.  EXAM: MRI LUMBAR SPINE WITHOUT AND WITH CONTRAST  TECHNIQUE: Multiplanar and multiecho pulse sequences of the lumbar spine were obtained without and with intravenous contrast.  CONTRAST:  41m MULTIHANCE GADOBENATE DIMEGLUMINE 529 MG/ML IV SOLN  COMPARISON:  None.  FINDINGS: Segmentation: 5 non rib-bearing lumbar type vertebral bodies are present.  Alignment: Slight retrolisthesis is present at L4-5. Slight anterolisthesis is present at L5-S1.  Vertebrae: Mild endplate marrow changes are present at L5-S1. Marrow signal and vertebral body heights are otherwise normal. Postcontrast images demonstrate no pathologic enhancement.  Conus medullaris and cauda equina: Conus extends to the T12-L1 level. Conus and cauda equina appear normal.  Paraspinal and other soft tissues: Limited imaging of the abdomen is unremarkable.  Disc levels:  L1-2: Negative.  L2-3: Negative.  L3-4: Leftward disc bulging is present without significant stenosis.  L4-5: A far left lateral disc protrusion and annular tear are present. Mild left subarticular and foraminal stenosis is evident. Right foramen is patent.  L5-S1: Mild disc bulging is present. There is no significant stenosis.  IMPRESSION: 1. Far left lateral disc protrusion and annular tear at L4-5 results in mild left subarticular and foraminal stenosis. This potentially impacts the left L5 and L4 nerve roots. 2. Mild disc bulging at L5-S1 without significant stenosis.   Electronically Signed   By: CSan MorelleM.D.   On: 06/07/2017 12:30   Knee-R MR wo  contrast:  Results for orders placed during the hospital encounter of 09/20/16  MR KNEE RIGHT WO CONTRAST   Narrative CLINICAL DATA:  Pain after dancing.  Swelling.  EXAM: MRI OF THE RIGHT KNEE WITHOUT CONTRAST  TECHNIQUE: Multiplanar, multisequence MR imaging of the knee was performed. No intravenous contrast was administered.  COMPARISON:  None.  FINDINGS: MENISCI  Medial meniscus: Small free edge and inferior articular surface tears involving the posterior horn mid body junction region.  Lateral meniscus: Discoid morphology with intrasubstance degenerative changes but no discrete tear.  LIGAMENTS  Cruciates:  Intact  Collaterals:  Intact.  Mild MCL and pes anserine bursitis.  CARTILAGE  Patellofemoral:  Mild degenerative chondrosis.  Medial: Mild to moderate degenerative chondrosis with early joint space narrowing and spurring. Subchondral cystic change noted in the medial tibial plateau.  Lateral:  Mild degenerative chondrosis and early spurring changes.  Joint:  Small joint effusion.  Popliteal Fossa:  Small Baker cyst.  Extensor Mechanism: The patella retinacular structures are intact and the quadriceps and patellar tendons are intact  Bones:  No acute bony findings.  Other: Normal knee musculature.  IMPRESSION: 1. Small free edge and inferior articular surface tears involving the posterior horn mid body junction region of the medial meniscus. 2. Discoid lateral meniscus with degenerative changes but no discrete tear. 3. Intact ligamentous structures and no acute bony findings. 4. Mild MCL and pes anserine bursitis. 5. Tricompartmental degenerative changes most significant in the medial compartment. 6. Small joint effusion and small Baker's cyst.  Electronically Signed   By: Marijo Sanes M.D.   On: 09/20/2016 10:15     Complexity Note: Imaging results reviewed. Results shared with Ms. Roque Cash, using Layman's terms.                          ROS  Cardiovascular: No reported cardiovascular signs or symptoms such as High blood pressure, coronary artery disease, abnormal heart rate or rhythm, heart attack, blood thinner therapy or heart weakness and/or failure Pulmonary or Respiratory: No reported pulmonary signs or symptoms such as wheezing and difficulty taking a deep full breath (Asthma), difficulty blowing air out (Emphysema), coughing up mucus (Bronchitis), persistent dry cough, or temporary stoppage of breathing during sleep Neurological: No reported neurological signs or symptoms such as seizures, abnormal skin sensations, urinary and/or fecal incontinence, being born with an abnormal open spine and/or a tethered spinal cord Review of Past Neurological Studies: No results found for this or any previous visit. Psychological-Psychiatric: No reported psychological or psychiatric signs or symptoms such as difficulty sleeping, anxiety, depression, delusions or hallucinations (schizophrenial), mood swings (bipolar disorders) or suicidal ideations or attempts Gastrointestinal: Reflux or heatburn Genitourinary: No reported renal or genitourinary signs or symptoms such as difficulty voiding or producing urine, peeing blood, non-functioning kidney, kidney stones, difficulty emptying the bladder, difficulty controlling the flow of urine, or chronic kidney disease Hematological: No reported hematological signs or symptoms such as prolonged bleeding, low or poor functioning platelets, bruising or bleeding easily, hereditary bleeding problems, low energy levels due to low hemoglobin or being anemic Endocrine: No reported endocrine signs or symptoms such as high or low blood sugar, rapid heart rate due to high thyroid levels, obesity or weight gain due to slow thyroid or thyroid disease Rheumatologic: No reported rheumatological signs and symptoms such as fatigue, joint pain, tenderness, swelling, redness, heat, stiffness, decreased range of motion,  with or without associated rash Musculoskeletal: Negative for myasthenia gravis, muscular dystrophy, multiple sclerosis or malignant hyperthermia Work History: Working full time  Allergies  Ms. Clopper has No Known Allergies.  Laboratory Chemistry  Inflammation Markers (CRP: Acute Phase) (ESR: Chronic Phase) No results found for: CRP, ESRSEDRATE, LATICACIDVEN                       Rheumatology Markers No results found for: RF, ANA, LABURIC, URICUR, LYMEIGGIGMAB, [facility]                      Renal Function Markers Lab Results  Component Value Date   BUN 11 03/28/2011   CREATININE 0.48 (L) 03/28/2011   GFRAA >60 03/28/2011   GFRNONAA >60 03/28/2011                              Hepatic Function Markers No results found for: AST, ALT, ALBUMIN, ALKPHOS, HCVAB, AMYLASE, LIPASE, AMMONIA                      Electrolytes Lab Results  Component Value Date   NA 144 03/28/2011   K 4.2 03/28/2011   CL 105 03/28/2011   CALCIUM 8.9 03/28/2011                        Neuropathy Markers No results found for: VITAMINB12, FOLATE, HGBA1C, HIV  Bone Pathology Markers No results found for: Marveen Reeks, [id number], [id number], 25OHVITD1, 25OHVITD2, 25OHVITD3, TESTOFREE, TESTOSTERONE                       Coagulation Parameters Lab Results  Component Value Date   PLT 266 03/28/2011                        Cardiovascular Markers Lab Results  Component Value Date   TROPONINI < 0.02 03/29/2011   HGB 13.3 03/28/2011   HCT 39.0 03/28/2011                         CA Markers No results found for: CEA, CA125, LABCA2                      Note: Lab results reviewed.  [place]  Drug: Ms. Pinkley  reports that she does not use drugs. Alcohol:  reports that she drinks alcohol. Tobacco:  reports that she has been smoking cigarettes.  She has a 10.75 pack-year smoking history. She has never used smokeless tobacco. Medical:  has a past medical history of Arthritis and  GERD (gastroesophageal reflux disease). Family: family history is not on file.  Past Surgical History:  Procedure Laterality Date  . ABDOMINAL HYSTERECTOMY    . COLONOSCOPY WITH PROPOFOL N/A 05/13/2015   Procedure: COLONOSCOPY WITH PROPOFOL;  Surgeon: Manya Silvas, MD;  Location: [facility] ENDOSCOPY;  Service: Endoscopy;  Laterality: N/A;  . ESOPHAGOGASTRODUODENOSCOPY (EGD) WITH PROPOFOL N/A 05/13/2015   Procedure: ESOPHAGOGASTRODUODENOSCOPY (EGD) WITH PROPOFOL;  Surgeon: Manya Silvas, MD;  Location: [facility] ENDOSCOPY;  Service: Endoscopy;  Laterality: N/A;  . KNEE ARTHROSCOPY    . KNEE ARTHROSCOPY Right 06/26/2017   Procedure: ARTHROSCOPY KNEE WITH DEBRIDEMENT AND REPAIR VS PARTIAL MEDIAL MENISCECTOMY;  Surgeon: Corky Mull, MD;  Location: [place];  Service: Orthopedics;  Laterality: Right;   Active Ambulatory Problems    Diagnosis Date Noted  . No Active Ambulatory Problems   Resolved Ambulatory Problems    Diagnosis Date Noted  . No Resolved Ambulatory Problems   Past Medical History:  Diagnosis Date  . Arthritis   . GERD (gastroesophageal reflux disease)    Constitutional Exam  General appearance: Well nourished, well developed, and well hydrated. In no apparent acute distress Vitals:   07/10/17 1211 07/10/17 1221 07/10/17 1231 07/10/17 1241  BP: 132/88 (!) 116/53 (!) 125/57 122/60  Pulse:      Resp: _0 Temp:  97.6 F (36.4 C)  97.8 F (36.6 C)  TempSrc:      SpO2: 98% 97% 99% 98%  Weight:      Height:       BMI Assessment: Estimated body mass index is 33.84 kg/m as calculated from the following:   Height as of this encounter: 5' 2" (1.575 m).   Weight as of this encounter: 185 lb (83.9 kg).  BMI interpretation table: BMI level Category Range association with higher incidence of chronic pain  <18 kg/m2 Underweight   18.5-24.9 kg/m2 Ideal body weight   25-29.9 kg/m2 Overweight Increased incidence by 20%  30-34.9 kg/m2 Obese (Class I) Increased  incidence by 68%  35-39.9 kg/m2 Severe obesity (Class II) Increased incidence by 136%  >40 kg/m2 Extreme obesity (Class III) Increased incidence by 254%   Patient's current BMI Ideal Body weight  Body mass index is 33.84 kg/m. Ideal body  weight: 50.1 kg (110 lb 7.2 oz) Adjusted ideal body weight: 63.6 kg (140 lb 4.3 oz)   BMI Readings from Last 4 Encounters:  07/10/17 33.84 kg/m  06/26/17 31.28 kg/m  05/13/15 31.62 kg/m   Wt Readings from Last 4 Encounters:  07/10/17 185 lb (83.9 kg)  06/26/17 188 lb (85.3 kg)  05/13/15 190 lb (86.2 kg)  Psych/Mental status: Alert, oriented x 3 (person, place, & time)       Eyes: PERLA Respiratory: No evidence of acute respiratory distress  Lumbar Spine Area Exam  Skin & Axial Inspection: No masses, redness, or swelling Alignment: Symmetrical Functional ROM: Unrestricted ROM       Stability: No instability detected Muscle Tone/Strength: Functionally intact. No obvious neuro-muscular anomalies detected. Sensory (Neurological): Dermatomal pain pattern Palpation: No palpable anomalies       Provocative Tests: Lumbar Hyperextension and rotation test: evaluation deferred today       Lumbar Lateral bending test: Positive ipsilateral radicular pain, on the left. Positive for left-sided foraminal stenosis. Patrick's Maneuver: evaluation deferred today                    Gait & Posture Assessment  Ambulation: Unassisted Gait: Relatively normal for age and body habitus Posture: WNL   Lower Extremity Exam    Side: Right lower extremity  Side: Left lower extremity  Skin & Extremity Inspection: Skin color, temperature, and hair growth are WNL. No peripheral edema or cyanosis. No masses, redness, swelling, asymmetry, or associated skin lesions. No contractures.  Skin & Extremity Inspection: Skin color, temperature, and hair growth are WNL. No peripheral edema or cyanosis. No masses, redness, swelling, asymmetry, or associated skin lesions. No  contractures.  Functional ROM: Unrestricted ROM          Functional ROM: Unrestricted ROM          Muscle Tone/Strength: Functionally intact. No obvious neuro-muscular anomalies detected.  Muscle Tone/Strength: Functionally intact. No obvious neuro-muscular anomalies detected.  Sensory (Neurological): Unimpaired  Sensory (Neurological): Unimpaired  Palpation: No palpable anomalies  Palpation: No palpable anomalies    Procedure  62 year old female with a history of axial low back pain that radiates down into her left leg secondary to lumbar radiculopathy most pronounced at L4-L5.  Patient is referred here from Dr. Cari Caraway with neurosurgery for lumbar epidural steroid injection.  Risks and benefits of the procedure have been discussed with the patient and she would like to proceed.  Please see procedure note.

## 2017-07-10 NOTE — Progress Notes (Signed)
Patient's Name: Laurie Gonzalez  MRN: [id number]  Referring Provider: Meade Maw, MD  DOB: Mar 02, 1956  PCP: Tracie Harrier, MD  DOS: 07/10/2017  Note by: Gillis Santa, MD  Service setting: Ambulatory outpatient  Specialty: Interventional Pain Management  Patient type: Established  Location: [facility] (AMB) Pain Management Facility  Visit type: Interventional Procedure   Primary Reason for Visit: Interventional Pain Management Treatment. CC: Back Pain (low) and Leg Pain (left )  Procedure:       Anesthesia, Analgesia, Anxiolysis:  Type: Therapeutic Inter-Laminar Epidural Steroid Injection          Region: Lumbar Level: L4-5 Level. Laterality: Left-Sided         Type: Moderate (Conscious) Sedation combined with Local Anesthesia Indication(s): Analgesia and Anxiety Route: Intravenous (IV) IV Access: Secured Sedation: Meaningful verbal contact was maintained at all times during the procedure  Local Anesthetic: Lidocaine 1-2%   Indications: 1. Lumbar radiculopathy    Pain Score: Pre-procedure: 4 /10 Post-procedure: 0-No pain/10  Pre-op Assessment:  Ms. Laurie Gonzalez is a 62 y.o. (year old), female patient, seen today for interventional treatment. She  has a past surgical history that includes Abdominal hysterectomy; Knee arthroscopy; Colonoscopy with propofol (N/A, 05/13/2015); Esophagogastroduodenoscopy (egd) with propofol (N/A, 05/13/2015); and Knee arthroscopy (Right, 06/26/2017). Ms. Laurie Gonzalez has a current medication list which includes the following prescription(s): hydrocodone-acetaminophen, bismuth subsalicylate, and pantoprazole, and the following Facility-Administered Medications: fentanyl. Her primarily concern today is the Back Pain (low) and Leg Pain (left )  Initial Vital Signs:  Pulse/HCG Rate: 84ECG Heart Rate: 83 Temp: 98.6 F (37 C) Resp: 18 BP: (!) 81/54(left 101/43) SpO2: 100 %  BMI: Estimated body mass index is 33.84 kg/m as calculated from the following:   Height as of this  encounter: 5\' 2"  (1.575 m).   Weight as of this encounter: 185 lb (83.9 kg).  Risk Assessment: Allergies: Reviewed. She has No Known Allergies.  Allergy Precautions: None required Coagulopathies: Reviewed. None identified.  Blood-thinner therapy: None at this time Active Infection(s): Reviewed. None identified. Ms. Laurie Gonzalez is afebrile  Site Confirmation: Ms. Laurie Gonzalez was asked to confirm the procedure and laterality before marking the site Procedure checklist: Completed Consent: Before the procedure and under the influence of no sedative(s), amnesic(s), or anxiolytics, the patient was informed of the treatment options, risks and possible complications. To fulfill our ethical and legal obligations, as recommended by the American Medical Association's Code of Ethics, I have informed the patient of my clinical impression; the nature and purpose of the treatment or procedure; the risks, benefits, and possible complications of the intervention; the alternatives, including doing nothing; the risk(s) and benefit(s) of the alternative treatment(s) or procedure(s); and the risk(s) and benefit(s) of doing nothing. The patient was provided information about the general risks and possible complications associated with the procedure. These may include, but are not limited to: failure to achieve desired goals, infection, bleeding, organ or nerve damage, allergic reactions, paralysis, and death. In addition, the patient was informed of those risks and complications associated to Spine-related procedures, such as failure to decrease pain; infection (i.e.: Meningitis, epidural or intraspinal abscess); bleeding (i.e.: epidural hematoma, subarachnoid hemorrhage, or any other type of intraspinal or peri-dural bleeding); organ or nerve damage (i.e.: Any type of peripheral nerve, nerve root, or spinal cord injury) with subsequent damage to sensory, motor, and/or autonomic systems, resulting in permanent pain, numbness, and/or  weakness of one or several areas of the body; allergic reactions; (i.e.: anaphylactic reaction); and/or death. Furthermore, the patient was informed  of those risks and complications associated with the medications. These include, but are not limited to: allergic reactions (i.e.: anaphylactic or anaphylactoid reaction(s)); adrenal axis suppression; blood sugar elevation that in diabetics may result in ketoacidosis or comma; water retention that in patients with history of congestive heart failure may result in shortness of breath, pulmonary edema, and decompensation with resultant heart failure; weight gain; swelling or edema; medication-induced neural toxicity; particulate matter embolism and blood vessel occlusion with resultant organ, and/or nervous system infarction; and/or aseptic necrosis of one or more joints. Finally, the patient was informed that Medicine is not an exact science; therefore, there is also the possibility of unforeseen or unpredictable risks and/or possible complications that may result in a catastrophic outcome. The patient indicated having understood very clearly. We have given the patient no guarantees and we have made no promises. Enough time was given to the patient to ask questions, all of which were answered to the patient's satisfaction. Ms. Laurie Gonzalez has indicated that she wanted to continue with the procedure. Attestation: I, the ordering provider, attest that I have discussed with the patient the benefits, risks, side-effects, alternatives, likelihood of achieving goals, and potential problems during recovery for the procedure that I have provided informed consent. Date  Time: 07/10/2017 10:40 AM  Pre-Procedure Preparation:  Monitoring: As per clinic protocol. Respiration, ETCO2, SpO2, BP, heart rate and rhythm monitor placed and checked for adequate function Safety Precautions: Patient was assessed for positional comfort and pressure points before starting the  procedure. Time-out: I initiated and conducted the "Time-out" before starting the procedure, as per protocol. The patient was asked to participate by confirming the accuracy of the "Time Out" information. Verification of the correct person, site, and procedure were performed and confirmed by me, the nursing staff, and the patient. "Time-out" conducted as per Joint Commission's Universal Protocol (UP.01.01.01). Time: 1157  Description of Procedure:       Position: Prone with head of the table was raised to facilitate breathing. Target Area: The interlaminar space, initially targeting the lower laminar border of the superior vertebral body. Approach: Paramedial approach. Area Prepped: Entire Posterior Lumbar Region Prepping solution: ChloraPrep (2% chlorhexidine gluconate and 70% isopropyl alcohol) Safety Precautions: Aspiration looking for blood return was conducted prior to all injections. At no point did we inject any substances, as a needle was being advanced. No attempts were made at seeking any paresthesias. Safe injection practices and needle disposal techniques used. Medications properly checked for expiration dates. SDV (single dose vial) medications used. Description of the Procedure: Protocol guidelines were followed. The procedure needle was introduced through the skin, ipsilateral to the reported pain, and advanced to the target area. Bone was contacted and the needle walked caudad, until the lamina was cleared. The epidural space was identified using "loss-of-resistance technique" with 2-3 ml of PF-NaCl (0.9% NSS), in a 5cc LOR glass syringe. Vitals:   07/10/17 1211 07/10/17 1221 07/10/17 1231 07/10/17 1241  BP: 132/88 (!) 116/53 (!) 125/57 122/60  Pulse:      Resp: 18 15 20 19   Temp:  97.6 F (36.4 C)  97.8 F (36.6 C)  TempSrc:      SpO2: 98% 97% 99% 98%  Weight:      Height:        Start Time: 1157 hrs. End Time: 1208 hrs. Materials:  Needle(s) Type: Epidural needle Gauge:  17G Length: 5-in Medication(s): Please see orders for medications and dosing details. 8 CC solution made of 5 cc of preservative-free saline,  2 cc of 0.2% ropivacaine, 1 cc of Decadron 10 mg/cc. Imaging Guidance (Spinal):  Type of Imaging Technique: Fluoroscopy Guidance (Spinal) Indication(s): Assistance in needle guidance and placement for procedures requiring needle placement in or near specific anatomical locations not easily accessible without such assistance. Exposure Time: Please see nurses notes. Contrast: Before injecting any contrast, we confirmed that the patient did not have an allergy to iodine, shellfish, or radiological contrast. Once satisfactory needle placement was completed at the desired level, radiological contrast was injected. Contrast injected under live fluoroscopy. No contrast complications. See chart for type and volume of contrast used. Fluoroscopic Guidance: I was personally present during the use of fluoroscopy. "Tunnel Vision Technique" used to obtain the best possible view of the target area. Parallax error corrected before commencing the procedure. "Direction-depth-direction" technique used to introduce the needle under continuous pulsed fluoroscopy. Once target was reached, antero-posterior, oblique, and lateral fluoroscopic projection used confirm needle placement in all planes. Images permanently stored in EMR. Interpretation: I personally interpreted the imaging intraoperatively. Adequate needle placement confirmed in multiple planes. Appropriate spread of contrast into desired area was observed. No evidence of afferent or efferent intravascular uptake. No intrathecal or subarachnoid spread observed. Permanent images saved into the patient's record.  Antibiotic Prophylaxis:   Anti-infectives (From admission, onward)   None     Indication(s): None identified  Post-operative Assessment:  Post-procedure Vital Signs:  Pulse/HCG Rate: 8476 Temp: 97.8 F (36.6  C) Resp: 19 BP: 122/60 SpO2: 98 %  EBL: None  Complications: No immediate post-treatment complications observed by team, or reported by patient.  Note: The patient tolerated the entire procedure well. A repeat set of vitals were taken after the procedure and the patient was kept under observation following institutional policy, for this type of procedure. Post-procedural neurological assessment was performed, showing return to baseline, prior to discharge. The patient was provided with post-procedure discharge instructions, including a section on how to identify potential problems. Should any problems arise concerning this procedure, the patient was given instructions to immediately contact us, at any time, without hesitation. In any case, we plan to contact the patient by telephone for a follow-up status report regarding this interventional procedure.  Comments:  No additional relevant information. 5 out of 5 strength bilateral lower extremity: Plantar flexion, dorsiflexion, knee flexion, knee extension.  Plan of Care   Imaging Orders     DG C-Arm 1-60 Min-No Report Procedure Orders    No procedure(s) ordered today    Medications ordered for procedure: Meds ordered this encounter  Medications  . fentaNYL (SUBLIMAZE) injection 25-100 mcg    Make sure Narcan is available in the pyxis when using this medication. In the event of respiratory depression (RR< 8/min): Titrate NARCAN (naloxone) in increments of 0.1 to 0.2 mg IV at 2-3 minute intervals, until desired degree of reversal.  . lactated ringers infusion 1,000 mL  . iopamidol (ISOVUE-M) 41 % intrathecal injection 10 mL  . ropivacaine (PF) 2 mg/mL (0.2%) (NAROPIN) injection 2 mL  . sodium chloride flush (NS) 0.9 % injection 2 mL  . dexamethasone (DECADRON) injection 10 mg  . lidocaine (PF) (XYLOCAINE) 1 % injection 4.5 mL   Medications administered: We administered fentaNYL, lactated ringers, iopamidol, ropivacaine (PF) 2 mg/mL  (0.2%), sodium chloride flush, dexamethasone, and lidocaine (PF).  See the medical record for exact dosing, route, and time of administration.  New Prescriptions   No medications on file   Disposition: Discharge home  Discharge Date & Time: 07/10/2017; 1245  hrs.    Future Appointments  Date Time Provider [place]  08/06/2017 10:15 AM Gillis Santa, MD [facility] None   Primary Care Physician: Tracie Harrier, MD Location: [facility] Out[facility] Management Facility Note by: Gillis Santa, MD Date: 07/10/2017; Time: 1:08 PM  Disclaimer:  Medicine is not an exact science. The only guarantee in medicine is that nothing is guaranteed. It is important to note that the decision to proceed with this intervention was based on the information collected from the patient. The Data and conclusions were drawn from the patient's questionnaire, the interview, and the physical examination. Because the information was provided in large part by the patient, it cannot be guaranteed that it has not been purposely or unconsciously manipulated. Every effort has been made to obtain as much relevant data as possible for this evaluation. It is important to note that the conclusions that lead to this procedure are derived in large part from the available data. Always take into account that the treatment will also be dependent on availability of resources and existing treatment guidelines, considered by other Pain Management Practitioners as being common knowledge and practice, at the time of the intervention. For Medico-Legal purposes, it is also important to point out that variation in procedural techniques and pharmacological choices are the acceptable norm. The indications, contraindications, technique, and results of the above procedure should only be interpreted and judged by a Board-Certified Interventional Pain Specialist with extensive familiarity and expertise in the same exact procedure and technique.

## 2017-07-10 NOTE — Patient Instructions (Signed)
Pain Management Discharge Instructions  General Discharge Instructions :  If you need to reach your doctor call: Monday-Friday 8:00 am - 4:00 pm at [phone] or toll free [phone].  After clinic hours [phone] to have operator reach doctor.  Bring all of your medication bottles to all your appointments in the pain clinic.  To cancel or reschedule your appointment with Pain Management please remember to call 24 hours in advance to avoid a fee.  Refer to the educational materials which you have been given on: General Risks, I had my Procedure. Discharge Instructions, Post Sedation.  Post Procedure Instructions:  The drugs you were given will stay in your system until tomorrow, so for the next 24 hours you should not drive, make any legal decisions or drink any alcoholic beverages.  You may eat anything you prefer, but it is better to start with liquids then soups and crackers, and gradually work up to solid foods.  Please notify your doctor immediately if you have any unusual bleeding, trouble breathing or pain that is not related to your normal pain.  Depending on the type of procedure that was done, some parts of your body may feel week and/or numb.  This usually clears up by tonight or the next day.  Walk with the use of an assistive device or accompanied by an adult for the 24 hours.  You may use ice on the affected area for the first 24 hours.  Put ice in a Ziploc bag and cover with a towel and place against area 15 minutes on 15 minutes off.  You may switch to heat after 24 hours.Epidural Steroid Injection Patient Information  Description: The epidural space surrounds the nerves as they exit the spinal cord.  In some patients, the nerves can be compressed and inflamed by a bulging disc or a tight spinal canal (spinal stenosis).  By injecting steroids into the epidural space, we can bring irritated nerves into direct contact with a potentially helpful medication.  These  steroids act directly on the irritated nerves and can reduce swelling and inflammation which often leads to decreased pain.  Epidural steroids may be injected anywhere along the spine and from the neck to the low back depending upon the location of your pain.   After numbing the skin with local anesthetic (like Novocaine), a small needle is passed into the epidural space slowly.  You may experience a sensation of pressure while this is being done.  The entire block usually last less than 10 minutes.  Conditions which may be treated by epidural steroids:   Low back and leg pain  Neck and arm pain  Spinal stenosis  Post-laminectomy syndrome  Herpes zoster (shingles) pain  Pain from compression fractures  Preparation for the injection:  1. Do not eat any solid food or dairy products within 8 hours of your appointment.  2. You may drink clear liquids up to 3 hours before appointment.  Clear liquids include water, black coffee, juice or soda.  No milk or cream please. 3. You may take your regular medication, including pain medications, with a sip of water before your appointment  Diabetics should hold regular insulin (if taken separately) and take 1/2 normal NPH dos the morning of the procedure.  Carry some sugar containing items with you to your appointment. 4. A driver must accompany you and be prepared to drive you home after your procedure.  5. Bring all your current medications with your. 6. An IV may be inserted and   sedation may be given at the discretion of the physician.   7. A blood pressure cuff, EKG and other monitors will often be applied during the procedure.  Some patients may need to have extra oxygen administered for a short period. 8. You will be asked to provide medical information, including your allergies, prior to the procedure.  We must know immediately if you are taking blood thinners (like Coumadin/Warfarin)  Or if you are allergic to IV iodine contrast (dye). We must  know if you could possible be pregnant.  Possible side-effects:  Bleeding from needle site  Infection (rare, may require surgery)  Nerve injury (rare)  Numbness & tingling (temporary)  Difficulty urinating (rare, temporary)  Spinal headache ( a headache worse with upright posture)  Light -headedness (temporary)  Pain at injection site (several days)  Decreased blood pressure (temporary)  Weakness in arm/leg (temporary)  Pressure sensation in back/neck (temporary)  Call if you experience:  Fever/chills associated with headache or increased back/neck pain.  Headache worsened by an upright position.  New onset weakness or numbness of an extremity below the injection site  Hives or difficulty breathing (go to the emergency room)  Inflammation or drainage at the infection site  Severe back/neck pain  Any new symptoms which are concerning to you  Please note:  Although the local anesthetic injected can often make your back or neck feel good for several hours after the injection, the pain will likely return.  It takes 3-7 days for steroids to work in the epidural space.  You may not notice any pain relief for at least that one week.  If effective, we will often do a series of three injections spaced 3-6 weeks apart to maximally decrease your pain.  After the initial series, we generally will wait several months before considering a repeat injection of the same type.  If you have any questions, please call [phone] [facility] 

## 2017-07-11 ENCOUNTER — Telehealth: Payer: Self-pay | Admitting: *Deleted

## 2017-07-11 NOTE — Telephone Encounter (Signed)
Spoke with patient via interpreter, appropriate questions answered.  Patient verbalizes understanding of information via interpreter, Herb Grays

## 2017-07-11 NOTE — Telephone Encounter (Signed)
Attempted phone call for f/up.  Patient does not speak english, will call for interpreter.

## 2017-08-06 ENCOUNTER — Ambulatory Visit
Payer: Commercial Managed Care - PPO | Attending: Student in an Organized Health Care Education/Training Program | Admitting: Student in an Organized Health Care Education/Training Program

## 2017-08-06 ENCOUNTER — Encounter: Payer: Self-pay | Admitting: Student in an Organized Health Care Education/Training Program

## 2017-08-06 VITALS — BP 128/69 | HR 85 | Temp 98.3°F | Resp 16 | Ht 65.0 in | Wt 180.0 lb

## 2017-08-06 DIAGNOSIS — G8929 Other chronic pain: Secondary | ICD-10-CM

## 2017-08-06 DIAGNOSIS — M5442 Lumbago with sciatica, left side: Secondary | ICD-10-CM | POA: Diagnosis not present

## 2017-08-06 DIAGNOSIS — G894 Chronic pain syndrome: Secondary | ICD-10-CM | POA: Insufficient documentation

## 2017-08-06 DIAGNOSIS — Z79899 Other long term (current) drug therapy: Secondary | ICD-10-CM | POA: Insufficient documentation

## 2017-08-06 DIAGNOSIS — K219 Gastro-esophageal reflux disease without esophagitis: Secondary | ICD-10-CM | POA: Diagnosis not present

## 2017-08-06 DIAGNOSIS — F17210 Nicotine dependence, cigarettes, uncomplicated: Secondary | ICD-10-CM | POA: Insufficient documentation

## 2017-08-06 DIAGNOSIS — M5416 Radiculopathy, lumbar region: Secondary | ICD-10-CM | POA: Insufficient documentation

## 2017-08-06 NOTE — Progress Notes (Signed)
Patient's Name: Laurie Gonzalez  MRN: [id number]  Referring Provider: Tracie Harrier, MD  DOB: Dec 26, 1955  PCP: Tracie Harrier, MD  DOS: 08/06/2017  Note by: Gillis Santa, MD  Service setting: Ambulatory outpatient  Specialty: Interventional Pain Management  Location: [facility] (AMB) Pain Management Facility    Patient type: Established   Primary Reason(s) for Visit: Encounter for post-procedure evaluation of chronic illness with mild to moderate exacerbation CC: Back Pain (lower left)  HPI  Laurie Gonzalez is a 62 y.o. year old, female patient, who comes today for a post-procedure evaluation. She has Lumbar radiculopathy and Chronic left-sided low back pain with left-sided sciatica on their problem list. Her primarily concern today is the Back Pain (lower left)  Pain Assessment: Location: Lower, Left Back Radiating: denies Onset: More than a month ago Duration: Chronic pain Quality: (no pain) Severity: 0-No pain/10 (subjective, self-reported pain score)  Note: Reported level is compatible with observation.                         When using our objective Pain Scale, levels between 6 and 10/10 are said to belong in an emergency room, as it progressively worsens from a 6/10, described as severely limiting, requiring emergency care not usually available at an outpatient pain management facility. At a 6/10 level, communication becomes difficult and requires great effort. Assistance to reach the emergency department may be required. Facial flushing and profuse sweating along with potentially dangerous increases in heart rate and blood pressure will be evident. Effect on ADL: procedure has helped the pain and activity level is better since the procedure. patient did have knee surgery which is still causing some pain Timing: Rarely Modifying factors: procedure BP: 128/69  HR: 85  Laurie Gonzalez comes in today for post-procedure evaluation after the treatment done on 07/11/2017.  Further details on both, my  assessment(s), as well as the proposed treatment plan, please see below.  Post-Procedure Assessment  07/10/2017 Procedure: Left L4-L5 epidural steroid injection Pre-procedure pain score:  4/10 Post-procedure pain score: 0/10         Influential Factors: BMI: 29.95 kg/m Intra-procedural challenges: None observed.         Assessment challenges: None detected.              Reported side-effects: None.        Post-procedural adverse reactions or complications: None reported         Sedation: Please see nurses note. When no sedatives are used, the analgesic levels obtained are directly associated to the effectiveness of the local anesthetics. However, when sedation is provided, the level of analgesia obtained during the initial 1 hour following the intervention, is believed to be the result of a combination of factors. These factors may include, but are not limited to: 1. The effectiveness of the local anesthetics used. 2. The effects of the analgesic(s) and/or anxiolytic(s) used. 3. The degree of discomfort experienced by the patient at the time of the procedure. 4. The patients ability and reliability in recalling and recording the events. 5. The presence and influence of possible secondary gains and/or psychosocial factors. Reported result: Relief experienced during the 1st hour after the procedure: 100 % (Ultra-Short Term Relief)            Interpretative annotation: Clinically appropriate result. Analgesia during this period is likely to be Local Anesthetic and/or IV Sedative (Analgesic/Anxiolytic) related.          Effects of local anesthetic: The  analgesic effects attained during this period are directly associated to the localized infiltration of local anesthetics and therefore cary significant diagnostic value as to the etiological location, or anatomical origin, of the pain. Expected duration of relief is directly dependent on the pharmacodynamics of the local anesthetic used. Long-acting  (4-6 hours) anesthetics used.  Reported result: Relief during the next 4 to 6 hour after the procedure: 100 % (Short-Term Relief)            Interpretative annotation: Clinically appropriate result. Analgesia during this period is likely to be Local Anesthetic-related.          Long-term benefit: Defined as the period of time past the expected duration of local anesthetics (1 hour for short-acting and 4-6 hours for long-acting). With the possible exception of prolonged sympathetic blockade from the local anesthetics, benefits during this period are typically attributed to, or associated with, other factors such as analgesic sensory neuropraxia, antiinflammatory effects, or beneficial biochemical changes provided by agents other than the local anesthetics.  Reported result: Extended relief following procedure: 100 % (Long-Term Relief)            Interpretative annotation: Clinically appropriate result. Complete relief. Therapeutic success. Inflammation plays a part in the etiology to the pain.          Current benefits: Defined as reported results that persistent at this point in time.   Analgesia: 100 %            Function: Laurie Gonzalez reports improvement in function ROM: Laurie Gonzalez reports improvement in ROM Interpretative annotation: Complete relief. Therapeutic success. Effective diagnostic intervention.          Interpretation: Results would suggest a successful diagnostic and therapeutic intervention.                  Plan:  Set up procedure as a PRN palliative treatment option for this patient.                Laboratory Chemistry  Inflammation Markers (CRP: Acute Phase) (ESR: Chronic Phase) No results found for: CRP, ESRSEDRATE, LATICACIDVEN                       Rheumatology Markers No results found for: RF, ANA, LABURIC, URICUR, LYMEIGGIGMAB, LYMEABIGMQN, HLAB27                      Renal Function Markers Lab Results  Component Value Date   BUN 11 03/28/2011   CREATININE 0.48 (L)  03/28/2011   GFRAA >60 03/28/2011   GFRNONAA >60 03/28/2011                              Hepatic Function Markers No results found for: AST, ALT, ALBUMIN, ALKPHOS, HCVAB, AMYLASE, LIPASE, AMMONIA                      Electrolytes Lab Results  Component Value Date   NA 144 03/28/2011   K 4.2 03/28/2011   CL 105 03/28/2011   CALCIUM 8.9 03/28/2011                        Neuropathy Markers No results found for: VITAMINB12, FOLATE, HGBA1C, HIV                      Bone Pathology Markers No results found for:  [place], [id number], [id number], [id number], 25OHVITD1, 25OHVITD2, 25OHVITD3, TESTOFREE, TESTOSTERONE                       Coagulation Parameters Lab Results  Component Value Date   PLT 266 03/28/2011                        Cardiovascular Markers Lab Results  Component Value Date   TROPONINI < 0.02 03/29/2011   HGB 13.3 03/28/2011   HCT 39.0 03/28/2011                         CA Markers No results found for: CEA, CA125, LABCA2                      Note: Lab results reviewed.  Recent Diagnostic Imaging Results  DG C-Arm 1-60 Min-No Report Fluoroscopy was utilized by the requesting physician.  No radiographic  interpretation.   Complexity Note: Imaging results reviewed. Results shared with Ms. Roque Cash, using Layman's terms.                         Meds   Current Outpatient Medications:  .  bismuth subsalicylate (PEPTO BISMOL) 262 MG chewable tablet, Chew 524 mg by mouth as needed., Disp: , Rfl:  .  HYDROcodone-acetaminophen (NORCO/VICODIN) 5-325 MG tablet, Take 1-2 tablets by mouth every 6 (six) hours as needed for moderate pain., Disp: 30 tablet, Rfl: 0 .  pantoprazole (PROTONIX) 40 MG tablet, Take 40 mg by mouth daily., Disp: , Rfl:   ROS  Constitutional: Denies any fever or chills Gastrointestinal: No reported hemesis, hematochezia, vomiting, or acute GI distress Musculoskeletal: Denies any acute onset joint swelling, redness, loss of ROM, or  weakness Neurological: No reported episodes of acute onset apraxia, aphasia, dysarthria, agnosia, amnesia, paralysis, loss of coordination, or loss of consciousness  Allergies  Ms. Reisz has No Known Allergies.  Kelly  Drug: Ms. Gloeckner  reports that she does not use drugs. Alcohol:  reports that she drinks alcohol. Tobacco:  reports that she has been smoking cigarettes.  She has a 10.75 pack-year smoking history. She has never used smokeless tobacco. Medical:  has a past medical history of Arthritis and GERD (gastroesophageal reflux disease). Surgical: Ms. Faraci  has a past surgical history that includes Abdominal hysterectomy; Knee arthroscopy; Colonoscopy with propofol (N/A, 05/13/2015); Esophagogastroduodenoscopy (egd) with propofol (N/A, 05/13/2015); and Knee arthroscopy (Right, 06/26/2017). Family: family history is not on file.  Constitutional Exam  General appearance: Well nourished, well developed, and well hydrated. In no apparent acute distress Vitals:   08/06/17 1033  BP: 128/69  Pulse: 85  Resp: 16  Temp: 98.3 F (36.8 C)  TempSrc: Oral  SpO2: 99%  Weight: 180 lb (81.6 kg)  Height: '5\' 5"'$  (1.651 m)   BMI Assessment: Estimated body mass index is 29.95 kg/m as calculated from the following:   Height as of this encounter: '5\' 5"'$  (1.651 m).   Weight as of this encounter: 180 lb (81.6 kg).  BMI interpretation table: BMI level Category Range association with higher incidence of chronic pain  <18 kg/m2 Underweight   18.5-24.9 kg/m2 Ideal body weight   25-29.9 kg/m2 Overweight Increased incidence by 20%  30-34.9 kg/m2 Obese (Class I) Increased incidence by 68%  35-39.9 kg/m2 Severe obesity (Class II) Increased incidence by 136%  >40 kg/m2 Extreme obesity (Class III) Increased incidence by  254%   Patient's current BMI Ideal Body weight  Body mass index is 29.95 kg/m. Ideal body weight: 57 kg (125 lb 10.6 oz) Adjusted ideal body weight: 66.9 kg (147 lb 6.4 oz)   BMI Readings  from Last 4 Encounters:  08/06/17 29.95 kg/m  07/10/17 33.84 kg/m  06/26/17 31.28 kg/m  05/13/15 31.62 kg/m   Wt Readings from Last 4 Encounters:  08/06/17 180 lb (81.6 kg)  07/10/17 185 lb (83.9 kg)  06/26/17 188 lb (85.3 kg)  05/13/15 190 lb (86.2 kg)  Psych/Mental status: Alert, oriented x 3 (person, place, & time)       Eyes: PERLA Respiratory: No evidence of acute respiratory distress  Cervical Spine Area Exam  Skin & Axial Inspection: No masses, redness, edema, swelling, or associated skin lesions Alignment: Symmetrical Functional ROM: Unrestricted ROM      Stability: No instability detected Muscle Tone/Strength: Functionally intact. No obvious neuro-muscular anomalies detected. Sensory (Neurological): Unimpaired Palpation: No palpable anomalies              Upper Extremity (UE) Exam    Side: Right upper extremity  Side: Left upper extremity  Skin & Extremity Inspection: Skin color, temperature, and hair growth are WNL. No peripheral edema or cyanosis. No masses, redness, swelling, asymmetry, or associated skin lesions. No contractures.  Skin & Extremity Inspection: Skin color, temperature, and hair growth are WNL. No peripheral edema or cyanosis. No masses, redness, swelling, asymmetry, or associated skin lesions. No contractures.  Functional ROM: Unrestricted ROM          Functional ROM: Unrestricted ROM          Muscle Tone/Strength: Functionally intact. No obvious neuro-muscular anomalies detected.  Muscle Tone/Strength: Functionally intact. No obvious neuro-muscular anomalies detected.  Sensory (Neurological): Unimpaired          Sensory (Neurological): Unimpaired          Palpation: No palpable anomalies              Palpation: No palpable anomalies              Provocative Test(s):  Phalen's test: deferred Tinel's test: deferred Apley's scratch test (touch opposite shoulder):  Action 1 (Across chest): deferred Action 2 (Overhead): deferred Action 3 (LB reach):  deferred   Provocative Test(s):  Phalen's test: deferred Tinel's test: deferred Apley's scratch test (touch opposite shoulder):  Action 1 (Across chest): deferred Action 2 (Overhead): deferred Action 3 (LB reach): deferred    Thoracic Spine Area Exam  Skin & Axial Inspection: No masses, redness, or swelling Alignment: Symmetrical Functional ROM: Unrestricted ROM Stability: No instability detected Muscle Tone/Strength: Functionally intact. No obvious neuro-muscular anomalies detected. Sensory (Neurological): Unimpaired Muscle strength & Tone: No palpable anomalies  Lumbar Spine Area Exam  Skin & Axial Inspection: No masses, redness, or swelling Alignment: Symmetrical Functional ROM: Improved after treatment       Stability: No instability detected Muscle Tone/Strength: Functionally intact. No obvious neuro-muscular anomalies detected. Sensory (Neurological): Improved Palpation: No palpable anomalies       Provocative Tests: Lumbar Hyperextension/rotation test: deferred today       Lumbar quadrant test (Kemp's test): deferred today       Lumbar Lateral bending test: deferred today       Patrick's Maneuver: deferred today                   FABER test: deferred today       Thigh-thrust test: deferred today  S-I compression test: deferred today       S-I distraction test: deferred today        Gait & Posture Assessment  Ambulation: Unassisted Gait: Relatively normal for age and body habitus Posture: WNL   Lower Extremity Exam    Side: Right lower extremity  Side: Left lower extremity  Stability: No instability observed          Stability: No instability observed          Skin & Extremity Inspection: Skin color, temperature, and hair growth are WNL. No peripheral edema or cyanosis. No masses, redness, swelling, asymmetry, or associated skin lesions. No contractures.  Skin & Extremity Inspection: Skin color, temperature, and hair growth are WNL. No peripheral edema or  cyanosis. No masses, redness, swelling, asymmetry, or associated skin lesions. No contractures.  Functional ROM: Unrestricted ROM                  Functional ROM: Unrestricted ROM                  Muscle Tone/Strength: Functionally intact. No obvious neuro-muscular anomalies detected.  Muscle Tone/Strength: Functionally intact. No obvious neuro-muscular anomalies detected.  Sensory (Neurological): Unimpaired  Sensory (Neurological): Unimpaired  Palpation: No palpable anomalies  Palpation: No palpable anomalies   Assessment  Primary Diagnosis & Pertinent Problem List: The primary encounter diagnosis was Lumbar radiculopathy. Diagnoses of Chronic left-sided low back pain with left-sided sciatica and Chronic pain syndrome were also pertinent to this visit.  Status Diagnosis  Improved Improved Improved 1. Lumbar radiculopathy   2. Chronic left-sided low back pain with left-sided sciatica   3. Chronic pain syndrome     Problems updated and reviewed during this visit: Problem  Lumbar Radiculopathy  Chronic Left-Sided Low Back Pain With Left-Sided Sciatica   General Recommendations: The pain condition that the patient suffers from is best treated with a multidisciplinary approach that involves an increase in physical activity to prevent de-conditioning and worsening of the pain cycle, as well as psychological counseling (formal and/or informal) to address the co-morbid psychological affects of pain. Treatment will often involve judicious use of pain medications and interventional procedures to decrease the pain, allowing the patient to participate in the physical activity that will ultimately produce long-lasting pain reductions. The goal of the multidisciplinary approach is to return the patient to a higher level of overall function and to restore their ability to perform activities of daily living.  62 year old female with a history of axial low back pain that radiates into her left leg  secondary to L4-L5 lumbar radiculopathy who follows up status post L4-L5 left epidural steroid injection performed on 07/10/2017.  Patient endorses significant/complete relief of her axial low back pain that radiates into her left leg.  She also endorses improvement in range of motion and functional status.  Patient is pleased with the results and is not endorsing any pain in that distribution at this time.  We will place order for PRN lumbar ESI should patient have return of symptoms.  Plan: -PRN left L4-L5 ESI with sedation  Plan of Care   Lab-work, procedure(s), and/or referral(s): Orders Placed This Encounter  Procedures  . Lumbar Epidural Injection   Provider-requested follow-up: Return if symptoms worsen or fail to improve. Time Note: Greater than 50% of the 15 minute(s) of face-to-face time spent with Ms. Mayeda, was spent in counseling/coordination of care regarding: Ms. Blaker primary cause of pain, the treatment plan, going over the informed consent, the  results, interpretation and significance of  her recent diagnostic interventional treatment(s) and the goals of pain management (increased in functionality).  No future appointments.  Primary Care Physician: Tracie Harrier, MD Location: [facility] Note by: Gillis Santa, M.D Date: 08/06/2017; Time: 10:54 AM  There are no Patient Instructions on file for this visit.

## 2017-08-06 NOTE — Progress Notes (Signed)
Safety precautions to be maintained throughout the outpatient stay will include: orient to surroundings, keep bed in low position, maintain call bell within reach at all times, provide assistance with transfer out of bed and ambulation.  

## 2017-09-06 ENCOUNTER — Ambulatory Visit
Admission: RE | Admit: 2017-09-06 | Discharge: 2017-09-06 | Disposition: A | Discharge status: Home / Self Care | Payer: Commercial Managed Care - PPO | Source: Ambulatory Visit | Attending: Internal Medicine | Admitting: Internal Medicine

## 2017-09-06 DIAGNOSIS — Z1231 Encounter for screening mammogram for malignant neoplasm of breast: Secondary | ICD-10-CM | POA: Insufficient documentation

## 2017-09-06 DIAGNOSIS — Z1239 Encounter for other screening for malignant neoplasm of breast: Secondary | ICD-10-CM

## 2017-12-06 ENCOUNTER — Other Ambulatory Visit: Payer: Self-pay

## 2017-12-06 ENCOUNTER — Encounter: Payer: Self-pay | Admitting: Emergency Medicine

## 2017-12-06 ENCOUNTER — Emergency Department
Admission: EM | Admit: 2017-12-06 | Discharge: 2017-12-06 | Disposition: A | Discharge status: Home / Self Care | Payer: Commercial Managed Care - PPO | Attending: Emergency Medicine | Admitting: Emergency Medicine

## 2017-12-06 ENCOUNTER — Emergency Department: Payer: Commercial Managed Care - PPO

## 2017-12-06 DIAGNOSIS — F17210 Nicotine dependence, cigarettes, uncomplicated: Secondary | ICD-10-CM | POA: Diagnosis not present

## 2017-12-06 DIAGNOSIS — R110 Nausea: Secondary | ICD-10-CM

## 2017-12-06 DIAGNOSIS — R1033 Periumbilical pain: Secondary | ICD-10-CM

## 2017-12-06 DIAGNOSIS — R197 Diarrhea, unspecified: Secondary | ICD-10-CM | POA: Diagnosis not present

## 2017-12-06 DIAGNOSIS — N390 Urinary tract infection, site not specified: Secondary | ICD-10-CM

## 2017-12-06 LAB — COMPREHENSIVE METABOLIC PANEL
ALT: 38 U/L (ref 0–44)
AST: 33 U/L (ref 15–41)
Albumin: 4.1 g/dL (ref 3.5–5.0)
Alkaline Phosphatase: 58 U/L (ref 38–126)
Anion gap: 6 (ref 5–15)
BUN: 13 mg/dL (ref 6–20)
CHLORIDE: 111 mmol/L (ref 98–111)
CO2: 24 mmol/L (ref 22–32)
Calcium: 9 mg/dL (ref 8.9–10.3)
Creatinine, Ser: 0.66 mg/dL (ref 0.44–1.00)
GFR calc Af Amer: >60 mL/min (ref >60)
Glucose, Bld: 121 mg/dL — ABNORMAL HIGH (ref 70–99)
POTASSIUM: 3.7 mmol/L (ref 3.5–5.1)
SODIUM: 141 mmol/L (ref 135–145)
Total Bilirubin: 0.6 mg/dL (ref 0.3–1.2)
Total Protein: 7.1 g/dL (ref 6.5–8.1)

## 2017-12-06 LAB — URINALYSIS, COMPLETE (UACMP) WITH MICROSCOPIC
Bilirubin Urine: NEGATIVE
GLUCOSE, UA: NEGATIVE mg/dL
HGB URINE DIPSTICK: NEGATIVE
KETONES UR: NEGATIVE mg/dL
Nitrite: NEGATIVE
PROTEIN: 30 mg/dL — AB
Specific Gravity, Urine: 1.03 (ref 1.005–1.030)
Squamous Epithelial / LPF: NONE SEEN (ref 0–5)
WBC UA: NONE SEEN WBC/hpf (ref 0–5)
pH: 5 (ref 5.0–8.0)

## 2017-12-06 LAB — CBC
HEMATOCRIT: 37.6 % (ref 35.0–47.0)
Hemoglobin: 13 g/dL (ref 12.0–16.0)
MCH: 31 pg (ref 26.0–34.0)
MCHC: 34.5 g/dL (ref 32.0–36.0)
MCV: 90 fL (ref 80.0–100.0)
Platelets: 295 10*3/uL (ref 150–440)
RBC: 4.18 MIL/uL (ref 3.80–5.20)
RDW: 13.6 % (ref 11.5–14.5)
WBC: 8.1 10*3/uL (ref 3.6–11.0)

## 2017-12-06 LAB — LIPASE, BLOOD: LIPASE: 30 U/L (ref 11–51)

## 2017-12-06 MED ORDER — IOPAMIDOL (ISOVUE-300) INJECTION 61%
30.0000 mL | Freq: Once | INTRAVENOUS | Status: AC | PRN
  Administered 2017-12-06: 30 mL via ORAL

## 2017-12-06 MED ORDER — HYDROCODONE-ACETAMINOPHEN 5-325 MG PO TABS: 1.0000 | ORAL_TABLET | Freq: Four times a day (QID) | ORAL | 0 refills | Status: DC | PRN

## 2017-12-06 MED ORDER — ONDANSETRON 4 MG PO TBDP
4.0000 mg | ORAL_TABLET | Freq: Once | ORAL | Status: AC | PRN
  Administered 2017-12-06: 4 mg via ORAL

## 2017-12-06 MED ORDER — IBUPROFEN 800 MG PO TABS: 800.0000 mg | ORAL_TABLET | Freq: Three times a day (TID) | ORAL | 0 refills | Status: DC | PRN

## 2017-12-06 MED ORDER — ONDANSETRON 4 MG PO TBDP: 4.0000 mg | ORAL_TABLET | Freq: Three times a day (TID) | ORAL | 0 refills | Status: DC | PRN

## 2017-12-06 MED ORDER — IOPAMIDOL (ISOVUE-300) INJECTION 61%
100.0000 mL | Freq: Once | INTRAVENOUS | Status: AC | PRN
  Administered 2017-12-06: 100 mL via INTRAVENOUS

## 2017-12-06 MED ORDER — ONDANSETRON HCL 4 MG/2ML IJ SOLN
4.0000 mg | Freq: Once | INTRAMUSCULAR | Status: AC
  Administered 2017-12-06: 4 mg via INTRAVENOUS
  Filled 2017-12-06: qty 2

## 2017-12-06 MED ORDER — FENTANYL CITRATE (PF) 100 MCG/2ML IJ SOLN
50.0000 ug | Freq: Once | INTRAMUSCULAR | Status: AC
  Administered 2017-12-06: 50 ug via INTRAVENOUS
  Filled 2017-12-06: qty 2

## 2017-12-06 MED ORDER — OXYCODONE-ACETAMINOPHEN 5-325 MG PO TABS
1.0000 | ORAL_TABLET | ORAL | Status: DC | PRN
  Administered 2017-12-06: 1 via ORAL

## 2017-12-06 MED ORDER — PIPERACILLIN-TAZOBACTAM 3.375 G IVPB 30 MIN
3.3750 g | Freq: Once | INTRAVENOUS | Status: AC
  Administered 2017-12-06: 3.375 g via INTRAVENOUS
  Filled 2017-12-06: qty 50

## 2017-12-06 MED ORDER — SODIUM CHLORIDE 0.9 % IV BOLUS
1000.0000 mL | Freq: Once | INTRAVENOUS | Status: AC
  Administered 2017-12-06: 1000 mL via INTRAVENOUS

## 2017-12-06 MED ORDER — CEPHALEXIN 500 MG PO CAPS: 500.0000 mg | ORAL_CAPSULE | Freq: Three times a day (TID) | ORAL | 0 refills | Status: DC

## 2017-12-06 NOTE — ED Notes (Signed)
Pt to CT via stretcher accomp by CT tech 

## 2017-12-06 NOTE — ED Notes (Signed)
CT notified pt completed drinking contrast 

## 2017-12-06 NOTE — ED Notes (Addendum)
Pt requesting pain meds

## 2017-12-06 NOTE — ED Notes (Addendum)
Pt reports immed relief of pain after admin of med

## 2017-12-06 NOTE — ED Notes (Signed)
Pt resting uprite on stretcher in exam room with no distress noted; family at bedside; Dr Beather Arbour in to assess pt; pt c/o left lower and mid lower abd pain since Wednesday accomp by nausea; denies hx of same; resp even/unlab, lungs clear, apical audible & regular, +BS, abd soft/nondist/tender to lower quad

## 2017-12-06 NOTE — ED Notes (Signed)
Lab results reviewed

## 2017-12-06 NOTE — ED Notes (Signed)
CT tech to bedside with PO contrast....allergies reviewed with patient...instructions for administration of PO contrast reviewed with patient -- patient verbalizes understanding of process. RN to f/u with and encourage patient to consume PO contrast volume. Patient to notify RN when volume completed and for any difficulties experienced while drinking --Patient verbalizes understanding  

## 2017-12-06 NOTE — ED Provider Notes (Signed)
[facility] Emergency Department Provider Note   ____________________________________________   First MD Initiated Contact with Patient 12/06/17 [phone]     (approximate)  I have reviewed the triage vital signs and the nursing notes.   HISTORY  Chief Complaint Abdominal Pain    HPI Laurie Gonzalez is a 62 y.o. female who presents to the ED from home with a chief complaint of abdominal pain.  Patient reports periumbilical abdominal pain which started 2 days ago.  Since that time, it has progressively worsened.  Now complains of periumbilical and suprapubic abdominal pain.  Symptoms associated with nausea without vomiting.  Had one episode of diarrhea this evening.  Denies associated fever, chills, chest pain, shortness of breath, dysuria.  Denies recent travel, trauma or antibiotic use.   Past Medical History:  Diagnosis Date  . Arthritis    knee  . GERD (gastroesophageal reflux disease)     Patient Active Problem List   Diagnosis Date Noted  . Lumbar radiculopathy 08/06/2017  . Chronic left-sided low back pain with left-sided sciatica 08/06/2017    Past Surgical History:  Procedure Laterality Date  . ABDOMINAL HYSTERECTOMY    . COLONOSCOPY WITH PROPOFOL N/A 05/13/2015   Procedure: COLONOSCOPY WITH PROPOFOL;  Surgeon: Manya Silvas, MD;  Location: [facility] ENDOSCOPY;  Service: Endoscopy;  Laterality: N/A;  . ESOPHAGOGASTRODUODENOSCOPY (EGD) WITH PROPOFOL N/A 05/13/2015   Procedure: ESOPHAGOGASTRODUODENOSCOPY (EGD) WITH PROPOFOL;  Surgeon: Manya Silvas, MD;  Location: [facility] ENDOSCOPY;  Service: Endoscopy;  Laterality: N/A;  . KNEE ARTHROSCOPY    . KNEE ARTHROSCOPY Right 06/26/2017   Procedure: ARTHROSCOPY KNEE WITH DEBRIDEMENT AND REPAIR VS PARTIAL MEDIAL MENISCECTOMY;  Surgeon: Corky Mull, MD;  Location: [place];  Service: Orthopedics;  Laterality: Right;    Prior to Admission medications   Medication Sig Start Date End Date Taking?  Authorizing Provider  bismuth subsalicylate (PEPTO BISMOL) 262 MG chewable tablet Chew 524 mg by mouth as needed.    [provider]  cephALEXin (KEFLEX) 500 MG capsule Take 1 capsule (500 mg total) by mouth 3 (three) times daily. 12/06/17   Paulette Blanch, MD  HYDROcodone-acetaminophen (NORCO) 5-325 MG tablet Take 1 tablet by mouth every 6 (six) hours as needed for moderate pain. 12/06/17   Paulette Blanch, MD  ibuprofen (ADVIL,MOTRIN) 800 MG tablet Take 1 tablet (800 mg total) by mouth every 8 (eight) hours as needed for moderate pain. 12/06/17   Paulette Blanch, MD  ondansetron (ZOFRAN ODT) 4 MG disintegrating tablet Take 1 tablet (4 mg total) by mouth every 8 (eight) hours as needed for nausea or vomiting. 12/06/17   Paulette Blanch, MD  pantoprazole (PROTONIX) 40 MG tablet Take 40 mg by mouth daily.    [provider]    Allergies Patient has no known allergies.  No family history on file.  Social History Social History   Tobacco Use  . Smoking status: Current Every Day Smoker    Packs/day: 0.25    Years: 43.00    Pack years: 10.75    Types: Cigarettes  . Smokeless tobacco: Never Used  . Tobacco comment: since age 33  Substance Use Topics  . Alcohol use: Yes    Comment: Holidays  . Drug use: No    Review of Systems  Constitutional: No fever/chills Eyes: No visual changes. ENT: No sore throat. Cardiovascular: Denies chest pain. Respiratory: Denies shortness of breath. Gastrointestinal: Positive for abdominal pain and nausea, no vomiting.  No diarrhea.  No constipation. Genitourinary: Negative for dysuria. Musculoskeletal: Negative for back pain. Skin: Negative for rash. Neurological: Negative for headaches, focal weakness or numbness.   ____________________________________________   PHYSICAL EXAM:  VITAL SIGNS: ED Triage Vitals  Enc Vitals Group     BP 12/06/17 0113 111/67     Pulse Rate 12/06/17 0113 81     Resp 12/06/17 0113 (!) 24     Temp 12/06/17  0113 97.8 F (36.6 C)     Temp Source 12/06/17 0113 Oral     SpO2 12/06/17 0113 100 %     Weight 12/06/17 0114 190 lb (86.2 kg)     Height 12/06/17 0114 5\' 3"  (1.6 m)     Head Circumference --      Peak Flow --      Pain Score 12/06/17 0114 8     Pain Loc --      Pain Edu? --      Excl. in [place]? --     Constitutional: Alert and oriented.  Uncomfortable appearing and in mild acute distress. Eyes: Conjunctivae are normal. PERRL. EOMI. Head: Atraumatic. Nose: No congestion/rhinnorhea. Mouth/Throat: Mucous membranes are moist.  Oropharynx non-erythematous. Neck: No stridor.   Cardiovascular: Normal rate, regular rhythm. Grossly normal heart sounds.  Good peripheral circulation. Respiratory: Normal respiratory effort.  No retractions. Lungs CTAB. Gastrointestinal: Soft and mildly tender to palpation umbilicus and suprapubic regions without rebound or guarding. No distention. No abdominal bruits. No CVA tenderness. Musculoskeletal: No lower extremity tenderness nor edema.  No joint effusions. Neurologic:  Normal speech and language. No gross focal neurologic deficits are appreciated. No gait instability. Skin:  Skin is warm, dry and intact. No rash noted. Psychiatric: Mood and affect are normal. Speech and behavior are normal.  ____________________________________________   LABS (all labs ordered are listed, but only abnormal results are displayed)  Labs Reviewed  COMPREHENSIVE METABOLIC PANEL - Abnormal; Notable for the following components:      Result Value   Glucose, Bld 121 (*)    All other components within normal limits  URINALYSIS, COMPLETE (UACMP) WITH MICROSCOPIC - Abnormal; Notable for the following components:   Color, Urine YELLOW (*)    APPearance TURBID (*)    Protein, ur 30 (*)    Leukocytes, UA MODERATE (*)    Bacteria, UA RARE (*)    All other components within normal limits  URINE CULTURE  LIPASE, BLOOD  CBC    ____________________________________________  EKG  None ____________________________________________  RADIOLOGY  ED MD interpretation: Normal appendix, no acute abnormality, probable cyst adjacent to left atrium which may be imaged non emergently  Official radiology report(s): Ct Abdomen Pelvis W Contrast  Result Date: 12/06/2017 CLINICAL DATA:  Left lower abdominal pain for several days EXAM: CT ABDOMEN AND PELVIS WITH CONTRAST TECHNIQUE: Multidetector CT imaging of the abdomen and pelvis was performed using the standard protocol following bolus administration of intravenous contrast. CONTRAST:  184mL ISOVUE-300 IOPAMIDOL (ISOVUE-300) INJECTION 61% COMPARISON:  None. FINDINGS: Lower chest: No acute abnormality. There is a hypodensity adjacent to the posterior aspect of the left atrium. This may represent a duplication cyst although incompletely evaluated on this exam. Hepatobiliary: Diffuse fatty infiltration of the liver is noted. The gallbladder is within normal limits. Pancreas: Pancreas is well visualized and within normal limits. No peripancreatic fluid is noted. Spleen: Normal in size without focal abnormality. Adrenals/Urinary Tract: The adrenal glands are unremarkable. The kidneys are well visualized bilaterally without renal calculi or urinary tract obstructive changes. Normal excretion  of contrast is noted bilaterally. The bladder is well distended. Stomach/Bowel: Stomach is within normal limits. Appendix appears normal. No evidence of bowel wall thickening, distention, or inflammatory changes. Vascular/Lymphatic: No significant vascular findings are present. No enlarged abdominal or pelvic lymph nodes. Reproductive: Status post hysterectomy. No adnexal masses. Other: No abdominal wall hernia or abnormality. No abdominopelvic ascites. Musculoskeletal: Mild degenerative changes of lumbar spine are noted. IMPRESSION: No acute abnormality is noted within the abdomen and pelvis. Hypodensity  adjacent to the posterior aspect of the left atrium. 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 Nonemergent workup (CT/MRI) can be performed as clinically indicated. Electronically Signed   By: Inez Catalina M.D.   On: 12/06/2017 07:16    ____________________________________________   PROCEDURES  Procedure(s) performed: None  Procedures  Critical Care performed: No  ____________________________________________   INITIAL IMPRESSION / ASSESSMENT AND PLAN / ED COURSE  As part of my medical decision making, I reviewed the following data within the electronic MEDICAL RECORD NUMBER History obtained from family, Nursing notes reviewed and incorporated, Labs reviewed, Old chart reviewed, Radiograph reviewed  and Notes from prior ED visits   63 year old female who presents with a 2-day history of periumbilical abdominal pain associated with nausea. Differential diagnosis includes, but is not limited to, ovarian cyst, ovarian torsion, acute appendicitis, diverticulitis, urinary tract infection/pyelonephritis, endometriosis, bowel obstruction, colitis, renal colic, gastroenteritis, hernia, fibroids, endometriosis, etc.  Laboratory and urinalysis results remarkable for normal WBC, moderate leukocytes in urine.  Will initiate IV fluid resuscitation, 50 mcg IV fentanyl for pain paired with 4 mg IV Zofran for nausea.  Will proceed with CT abdomen/pelvis to evaluate etiology of patient's symptoms.  Clinical Course as of Dec 06 744  Fri Dec 06, 2017  0738 Patient feeling better.  Updated patient and her daughter on CT results.  She will follow-up with her PCP for further imaging of possible duplication cyst of the atrium.  Will discharge home on antibiotics and antiemetics.  Strict return precautions given.  Both verbalize understanding and agree with plan of care.   [JS]    Clinical Course User Index [JS] Paulette Blanch, MD     ____________________________________________   FINAL CLINICAL  IMPRESSION(S) / ED DIAGNOSES  Final diagnoses:  Periumbilical abdominal pain  Lower urinary tract infectious disease  Nausea  Diarrhea, unspecified type     ED Discharge Orders         Ordered    cephALEXin (KEFLEX) 500 MG capsule  3 times daily     12/06/17 0743    ondansetron (ZOFRAN ODT) 4 MG disintegrating tablet  Every 8 hours PRN     12/06/17 0743    HYDROcodone-acetaminophen (NORCO) 5-325 MG tablet  Every 6 hours PRN     12/06/17 0743    ibuprofen (ADVIL,MOTRIN) 800 MG tablet  Every 8 hours PRN     12/06/17 0743           Note:  This document was prepared using Dragon voice recognition software and may include unintentional dictation errors.    Paulette Blanch, MD 12/06/17 [phone]

## 2017-12-06 NOTE — Discharge Instructions (Addendum)
1.  Take antibiotic as prescribed (Keflex 500 mg 3 times daily for 7 days). 2.  You may take medicines as needed for pain and nausea (Motrin/Norco/Zofran). 3.  Return to the ER for worsening symptoms, persistent vomiting, difficulty breathing or other concerns.

## 2017-12-06 NOTE — ED Triage Notes (Addendum)
Pt presents to ED with left lower abd pain and nausea that started Wednesday and has gotten progressively worse. Pt holding her lower abd in triage. Denies similar pain previously. Denies vomiting. Diarrhea X1 earlier this evening.

## 2017-12-06 NOTE — ED Notes (Signed)
DC instructions discussed with patient and daughter, encouraged to follow up with Dr. Ginette Pitman within the next week. Informed them that the patient needs to complete entire course of antibiotics.  Pt escorted to lobby via wheelchair.

## 2017-12-07 LAB — URINE CULTURE

## 2017-12-20 ENCOUNTER — Other Ambulatory Visit: Payer: Self-pay | Admitting: Internal Medicine

## 2017-12-20 DIAGNOSIS — R1032 Left lower quadrant pain: Secondary | ICD-10-CM

## 2017-12-20 DIAGNOSIS — R1031 Right lower quadrant pain: Secondary | ICD-10-CM

## 2017-12-20 DIAGNOSIS — R935 Abnormal findings on diagnostic imaging of other abdominal regions, including retroperitoneum: Secondary | ICD-10-CM

## 2017-12-20 DIAGNOSIS — Z6835 Body mass index (BMI) 35.0-35.9, adult: Secondary | ICD-10-CM

## 2017-12-20 DIAGNOSIS — F17200 Nicotine dependence, unspecified, uncomplicated: Secondary | ICD-10-CM

## 2017-12-31 ENCOUNTER — Ambulatory Visit
Admission: RE | Admit: 2017-12-31 | Discharge: 2017-12-31 | Disposition: A | Discharge status: Home / Self Care | Payer: Commercial Managed Care - PPO | Source: Ambulatory Visit | Attending: Internal Medicine | Admitting: Internal Medicine

## 2017-12-31 DIAGNOSIS — R1032 Left lower quadrant pain: Secondary | ICD-10-CM | POA: Diagnosis not present

## 2017-12-31 DIAGNOSIS — F17200 Nicotine dependence, unspecified, uncomplicated: Secondary | ICD-10-CM | POA: Insufficient documentation

## 2017-12-31 DIAGNOSIS — R1031 Right lower quadrant pain: Secondary | ICD-10-CM

## 2017-12-31 DIAGNOSIS — Z6835 Body mass index (BMI) 35.0-35.9, adult: Secondary | ICD-10-CM

## 2017-12-31 DIAGNOSIS — R935 Abnormal findings on diagnostic imaging of other abdominal regions, including retroperitoneum: Secondary | ICD-10-CM | POA: Insufficient documentation

## 2017-12-31 MED ORDER — IOPAMIDOL (ISOVUE-300) INJECTION 61%
75.0000 mL | Freq: Once | INTRAVENOUS | Status: AC | PRN
  Administered 2017-12-31: 75 mL via INTRAVENOUS

## 2017-12-31 MED ORDER — IOPAMIDOL (ISOVUE-300) INJECTION 61%: 100.0000 mL | Freq: Once | INTRAVENOUS | Status: DC | PRN

## 2018-01-03 ENCOUNTER — Other Ambulatory Visit: Payer: Self-pay | Admitting: Internal Medicine

## 2018-01-03 DIAGNOSIS — R911 Solitary pulmonary nodule: Secondary | ICD-10-CM

## 2018-01-03 DIAGNOSIS — R935 Abnormal findings on diagnostic imaging of other abdominal regions, including retroperitoneum: Secondary | ICD-10-CM

## 2018-01-31 ENCOUNTER — Encounter: Payer: Self-pay | Admitting: Cardiothoracic Surgery

## 2018-01-31 ENCOUNTER — Ambulatory Visit (INDEPENDENT_AMBULATORY_CARE_PROVIDER_SITE_OTHER): Payer: Commercial Managed Care - PPO | Admitting: Cardiothoracic Surgery

## 2018-01-31 ENCOUNTER — Other Ambulatory Visit: Payer: Self-pay

## 2018-01-31 VITALS — BP 107/71 | HR 84 | Temp 97.9°F | Resp 18 | Ht 63.0 in | Wt 192.8 lb

## 2018-01-31 DIAGNOSIS — J9859 Other diseases of mediastinum, not elsewhere classified: Secondary | ICD-10-CM | POA: Diagnosis not present

## 2018-01-31 NOTE — Progress Notes (Signed)
Patient ID: Laurie Gonzalez, female   DOB: 28-Jan-1956, 62 y.o.   MRN: [id number]  Chief Complaint  Patient presents with  . 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 Ginette Pitman Reason for Referral pericardial mass  HPI Location, Quality, Duration, Severity, Timing, Context, Modifying Factors, Associated Signs and Symptoms.  Laurie Gonzalez is a 62 y.o. female.  Her problems began several weeks ago when she presented to the emergency room for ultimately was determined to be a urinary tract infection.  She was subsequently referred to her primary care doctor Dr. Renae Gloss day who reviewed the abdominal CT scan.  That was performed for evaluation of her abdominal pain and the very first cuts showed a small lesion just posterior to the left atrium on the right.  She then had a dedicated chest CT and this was felt to probably be a cystic lesion or lymph node.  Since her abdominal pain is resolved she is now complaining of some pain along her left breast which radiates around to her back.  This is been going on for quite a while now.  She has had about a 5 pound weight loss over the last month secondary to diet.  She has had no dysphagia, hemoptysis or hematemesis.  She has a history of lung cancer in her mother and sister.  She has not been exposed to asbestos.  She is an ex-smoker having quit ago   Past Medical History:  Diagnosis Date  . Arthritis    knee  . GERD (gastroesophageal reflux disease)     Past Surgical History:  Procedure Laterality Date  . ABDOMINAL HYSTERECTOMY    . COLONOSCOPY WITH PROPOFOL N/A 05/13/2015   Procedure: COLONOSCOPY WITH PROPOFOL;  Surgeon: Manya Silvas, MD;  Location: [facility] ENDOSCOPY;  Service: Endoscopy;  Laterality: N/A;  . ESOPHAGOGASTRODUODENOSCOPY (EGD) WITH PROPOFOL N/A 05/13/2015   Procedure: ESOPHAGOGASTRODUODENOSCOPY (EGD) WITH PROPOFOL;  Surgeon: Manya Silvas, MD;  Location: [facility] ENDOSCOPY;  Service:  Endoscopy;  Laterality: N/A;  . KNEE ARTHROSCOPY    . KNEE ARTHROSCOPY Right 06/26/2017   Procedure: ARTHROSCOPY KNEE WITH DEBRIDEMENT AND REPAIR VS PARTIAL MEDIAL MENISCECTOMY;  Surgeon: Corky Mull, MD;  Location: [place];  Service: Orthopedics;  Laterality: Right;    History reviewed. No pertinent family history.  Social History Social History   Tobacco Use  . Smoking status: Former Smoker    Packs/day: 0.25    Years: 43.00    Pack years: 10.75    Types: Cigarettes  . Smokeless tobacco: Never Used  . Tobacco comment: since age 65  Substance Use Topics  . Alcohol use: Yes    Comment: Holidays  . Drug use: No    No Known Allergies  No current outpatient medications on file.   No current facility-administered medications for this visit.       Review of Systems A complete review of systems was asked and was negative except for the following positive findings left breast pain.  Blood pressure 107/71, pulse 84, temperature 97.9 F (36.6 C), temperature source Temporal, resp. rate 18, height 5\' 3"  (1.6 m), weight 192 lb 12.8 oz (87.5 kg), SpO2 98 %.  Physical Exam CONSTITUTIONAL:  Pleasant, well-developed, well-nourished, and in no acute distress. EYES: Pupils equal and reactive to light, Sclera non-icteric EARS, NOSE, MOUTH AND THROAT:  The oropharynx was clear.  Dentition is good repair.  Oral mucosa pink and moist. LYMPH NODES:  Lymph nodes in the neck and axillae were normal RESPIRATORY:  Lungs were clear.  Normal respiratory effort without pathologic use of accessory muscles of respiration CARDIOVASCULAR: Heart was regular without murmurs.  There were no carotid bruits. GI: The abdomen was soft, nontender, and nondistended. There were no palpable masses. There was no hepatosplenomegaly. There were normal bowel sounds in all quadrants. GU:  Rectal deferred.   MUSCULOSKELETAL:  Normal muscle strength and tone.  No clubbing or cyanosis.   SKIN:  There were  no pathologic skin lesions.  There were no nodules on palpation. NEUROLOGIC:  Sensation is normal.  Cranial nerves are grossly intact. PSYCH:  Oriented to person, place and time.  Mood and affect are normal.  Data Reviewed CT of the chest and CT of the abdomen  I have personally reviewed the patient's imaging, laboratory findings and medical records.    Assessment    I have independently reviewed the CT scans.  The lesion in the posterior aspect of the left atrium does look like a mildly enlarged lymph node or perhaps even a cystic structure.  I do not see anything that is concerning for malignancy.    Plan    After careful discussion with the patient we would like to bring her back in 4 months to repeat the chest CT.  This is scheduled for early February.  The patient would like to come back and get my opinion about that area prior to seeing Dr. Gustavus Bryant in follow-up.  We will go ahead and set that up.       Nestor Lewandowsky, MD 01/31/2018, 12:02 PM

## 2018-01-31 NOTE — Patient Instructions (Addendum)
Patient is to return to the office after she has scheduled Ct Scan.   Call the office with any questions or concerns.

## 2018-02-07 ENCOUNTER — Encounter (HOSPITAL_COMMUNITY): Payer: Self-pay | Admitting: *Deleted

## 2018-02-07 ENCOUNTER — Emergency Department (HOSPITAL_COMMUNITY)
Admission: EM | Admit: 2018-02-07 | Discharge: 2018-02-07 | Disposition: A | Discharge status: Home / Self Care | Payer: Commercial Managed Care - PPO | Attending: Emergency Medicine | Admitting: Emergency Medicine

## 2018-02-07 ENCOUNTER — Other Ambulatory Visit: Payer: Self-pay

## 2018-02-07 ENCOUNTER — Emergency Department (HOSPITAL_COMMUNITY): Payer: Commercial Managed Care - PPO

## 2018-02-07 DIAGNOSIS — W010XXA Fall on same level from slipping, tripping and stumbling without subsequent striking against object, initial encounter: Secondary | ICD-10-CM | POA: Insufficient documentation

## 2018-02-07 DIAGNOSIS — M545 Low back pain, unspecified: Secondary | ICD-10-CM

## 2018-02-07 DIAGNOSIS — S8391XA Sprain of unspecified site of right knee, initial encounter: Secondary | ICD-10-CM

## 2018-02-07 DIAGNOSIS — M79602 Pain in left arm: Secondary | ICD-10-CM | POA: Insufficient documentation

## 2018-02-07 DIAGNOSIS — Y999 Unspecified external cause status: Secondary | ICD-10-CM | POA: Insufficient documentation

## 2018-02-07 DIAGNOSIS — Y92513 Shop (commercial) as the place of occurrence of the external cause: Secondary | ICD-10-CM | POA: Insufficient documentation

## 2018-02-07 DIAGNOSIS — Z87891 Personal history of nicotine dependence: Secondary | ICD-10-CM | POA: Diagnosis not present

## 2018-02-07 DIAGNOSIS — S8991XA Unspecified injury of right lower leg, initial encounter: Secondary | ICD-10-CM | POA: Diagnosis present

## 2018-02-07 DIAGNOSIS — Y9301 Activity, walking, marching and hiking: Secondary | ICD-10-CM | POA: Insufficient documentation

## 2018-02-07 MED ORDER — METHOCARBAMOL 500 MG PO TABS: 500.0000 mg | ORAL_TABLET | Freq: Two times a day (BID) | ORAL | 0 refills | Status: DC

## 2018-02-07 MED ORDER — ACETAMINOPHEN 500 MG PO TABS
1000.0000 mg | ORAL_TABLET | Freq: Once | ORAL | Status: AC
  Administered 2018-02-07: 1000 mg via ORAL
  Filled 2018-02-07: qty 2

## 2018-02-07 NOTE — ED Triage Notes (Signed)
[place] on a small puddle of water in the bathroom.  Pt speaks limited English. Pt is spanish speaking. Pt denied LOC to EMS. Pt reports left shoulder and right knee pain. Upon arrival to ED, pt grabbed her back when she transferred from the stretcher to the wheelchair. EMS stated that the shoulder and knee pain increased pain with palpation. No deformities noted by EMS.  Earlier this year right knee surgery in the may of this year r/t tendons.  Pt also had left knee surgery 2 years ago.    EMS VS: BP: 166/90, HR: 76, RR: 16

## 2018-02-07 NOTE — Discharge Instructions (Signed)
You can take Tylenol or Ibuprofen as directed for pain. You can alternate Tylenol and Ibuprofen every 4 hours. If you take Tylenol at 1pm, then you can take Ibuprofen at 5pm. Then you can take Tylenol again at 9pm.  Take Robaxin as prescribed. This medication will make you drowsy so do not drive or drink alcohol when taking it.   Wear the knee immobilizer for support and stabilization.  Use crutches as needed to keep weight off knee.  Make sure you are elevating the knee and  applying ice to help with pain and swelling.  Follow-up with referred orthopedic doctor for further evaluation of the knee does not improve.  Return to emergency department for any worsening pain, difficulty walking, numbness/weakness of your arms or legs or any other worsening or concerning symptoms.

## 2018-02-07 NOTE — ED Provider Notes (Signed)
[facility] DEPT Provider Note   CSN: [id number] Arrival date & time: 02/07/18  1757     History   Chief Complaint Chief Complaint  Patient presents with  . Fall    HPI Laurie Gonzalez is a 62 y.o. female.  Possible history of arthritis, GERD who presents for evaluation of bilateral knee pain, right greater than left, left lower back pain after mechanical fall that occurred just prior to ED arrival.  Patient reports she was at the mall and she slipped on a small pulsatile of water, causing her to fall backwards.  She thinks that when she fell, she twisted her right knee.  She states she did not hit her head or lose consciousness.  She is not currently on any blood thinners.  Denies any vision changes, numbness/weakness of her arms or legs, nausea/vomiting.  She reports that she was able to get up with assistance and get into a wheelchair but has not ambulated since the incident.  Patient reports pain is worse in her right knee which she thinks she twisted when she fell.  She also reports some pain to the left knee but states it is less than her right.  She additionally has some pain in the lower back that radiates in the left gluteus.  She has not taken medication for pain.  Patient also reports some diffuse left upper arm pain which she thinks she landed on when she fell.  Patient denies any neck pain, numbness/weakness, chest pain, difficulty breathing.  The history is provided by the patient and a relative. The history is limited by a language barrier. No language interpreter was used (Family wished to interpret).    Past Medical History:  Diagnosis Date  . Arthritis    knee  . GERD (gastroesophageal reflux disease)     Patient Active Problem List   Diagnosis Date Noted  . Lumbar radiculopathy 08/06/2017  . Chronic left-sided low back pain with left-sided sciatica 08/06/2017    Past Surgical History:  Procedure Laterality Date  . ABDOMINAL  HYSTERECTOMY    . COLONOSCOPY WITH PROPOFOL N/A 05/13/2015   Procedure: COLONOSCOPY WITH PROPOFOL;  Surgeon: Manya Silvas, MD;  Location: [facility] ENDOSCOPY;  Service: Endoscopy;  Laterality: N/A;  . ESOPHAGOGASTRODUODENOSCOPY (EGD) WITH PROPOFOL N/A 05/13/2015   Procedure: ESOPHAGOGASTRODUODENOSCOPY (EGD) WITH PROPOFOL;  Surgeon: Manya Silvas, MD;  Location: [facility] ENDOSCOPY;  Service: Endoscopy;  Laterality: N/A;  . KNEE ARTHROSCOPY    . KNEE ARTHROSCOPY Right 06/26/2017   Procedure: ARTHROSCOPY KNEE WITH DEBRIDEMENT AND REPAIR VS PARTIAL MEDIAL MENISCECTOMY;  Surgeon: Corky Mull, MD;  Location: [facility];  Service: Orthopedics;  Laterality: Right;     OB History   None      Home Medications    Prior to Admission medications   Medication Sig Start Date End Date Taking? Authorizing Provider  methocarbamol (ROBAXIN) 500 MG tablet Take 1 tablet (500 mg total) by mouth 2 (two) times daily. 02/07/18   Volanda Napoleon, PA-C    Family History No family history on file.  Social History Social History   Tobacco Use  . Smoking status: Former Smoker    Packs/day: 0.25    Years: 43.00    Pack years: 10.75    Types: Cigarettes  . Smokeless tobacco: Never Used  . Tobacco comment: since age 58  Substance Use Topics  . Alcohol use: Yes    Comment: Holidays  . Drug use: No     Allergies  Patient has no known allergies.   Review of Systems Review of Systems  Eyes: Negative for visual disturbance.  Respiratory: Negative for shortness of breath.   Cardiovascular: Negative for chest pain.  Gastrointestinal: Negative for nausea and vomiting.  Musculoskeletal: Positive for back pain. Negative for neck pain.       Knee pain Hip pain Arm pain  Neurological: Negative for weakness, numbness and headaches.  All other systems reviewed and are negative.    Physical Exam Updated Vital Signs BP 114/78 (BP Location: Left Arm)   Pulse 63   Temp 98.2 F (36.8 C) (Oral)    Resp 16   SpO2 100%   Physical Exam  Constitutional: She appears well-developed and well-nourished.  HENT:  Head: Normocephalic and atraumatic.  No tenderness to palpation of skull. No deformities or crepitus noted. No open wounds, abrasions or lacerations.   Eyes: Conjunctivae and EOM are normal. Right eye exhibits no discharge. Left eye exhibits no discharge. No scleral icterus.  Neck: Full passive range of motion without pain. No spinous process tenderness present.  Full flexion/extension and lateral movement of neck fully intact. No bony midline tenderness. No deformities or crepitus.   Cardiovascular:  Pulses:      Radial pulses are 2+ on the right side, and 2+ on the left side.       Dorsalis pedis pulses are 2+ on the right side, and 2+ on the left side.  Pulmonary/Chest: Effort normal.  Musculoskeletal:       Thoracic back: She exhibits no tenderness.       Lumbar back: She exhibits tenderness.       Back:  No midline bony tenderness to the spine.  No deformity or crepitus noted.  Diffuse lumbar tenderness noted that begins at midline extensive paraspinal muscles and gluteal region.  No deformity or crepitus noted.  No bony tenderness overlying the left hip.  Flexion/extension of left lower extremity intact on difficulty.  Internal and external rotation of left hip intact without any difficulty.  Mild tenderness palpation of the anterior left knee.  No deformity or crepitus noted.  Overlying soft tissue swelling.  Negative anterior posterior drawer test.  Known stability noted on varus or valgus stress.  No tenderness palpation of the proximal, distal tib-fib or ankle.  Tenderness palpation noted to anterior aspect of right knee with some overlying soft tissue swelling.  Negative anterior posterior drawer test.  Subjective pain with varus stress.  No instability noted.  No instability noted with valgus stress.  She is able to gravity.  Diffuse muscular tenderness to the mid upper arm.   No bony tenderness noted.  No deformity or crepitus noted.  Full range of motion of left upper extremity any difficulty.  No bony tenderness noted to left shoulder, left elbow.  Flexion to the left elbow intact on difficulty.  No bony tenderness noted to left wrist.  Flexion/tension of left wrist intact any difficulty.  Equal grip strength bilaterally.  Neurological: She is alert.  Follows commands, Moves all extremities  5/5 strength to BUE and BLE  Sensation intact throughout all major nerve distributions  Skin: Skin is warm and dry. Capillary refill takes less than 2 seconds.  Psychiatric: She has a normal mood and affect. Her speech is normal and behavior is normal.  Nursing note and vitals reviewed.    ED Treatments / Results  Labs (all labs ordered are listed, but only abnormal results are displayed) Labs Reviewed - No data to display  EKG None  Radiology Dg Lumbar Spine Complete  Result Date: 02/07/2018 CLINICAL DATA:  Low back pain due to a slip and fall in a bathroom today. Initial encounter. EXAM: LUMBAR SPINE - COMPLETE 4+ VIEW COMPARISON:  CT abdomen and pelvis 12/06/2017. FINDINGS: There is no evidence of lumbar spine fracture. Alignment is normal. Intervertebral disc spaces are maintained. IMPRESSION: Negative exam. Electronically Signed   By: Inge Rise M.D.   On: 02/07/2018 20:31   Dg Knee Complete 4 Views Left  Result Date: 02/07/2018 CLINICAL DATA:  Tripped and fell while walking today with left knee pain. EXAM: LEFT KNEE - COMPLETE 4+ VIEW COMPARISON:  None. FINDINGS: Mild degenerative change over the medial compartment. No acute fracture or dislocation. No significant joint effusion. IMPRESSION: No acute findings. Electronically Signed   By: Marin Olp M.D.   On: 02/07/2018 19:06   Dg Knee Complete 4 Views Right  Result Date: 02/07/2018 CLINICAL DATA:  Tripped while walking at the mall with right knee pain. EXAM: RIGHT KNEE - COMPLETE 4+ VIEW COMPARISON:   None. FINDINGS: Mild degenerative changes over the medial compartment. No evidence of acute fracture or dislocation. No joint effusion. IMPRESSION: No acute findings. Electronically Signed   By: Marin Olp M.D.   On: 02/07/2018 19:05   Dg Hip Unilat With Pelvis 2-3 Views Left  Result Date: 02/07/2018 CLINICAL DATA:  Fall while walking today with left hip pain. EXAM: DG HIP (WITH OR WITHOUT PELVIS) 2-3V LEFT COMPARISON:  None. FINDINGS: There is no evidence of hip fracture or dislocation. There is no evidence of arthropathy or other focal bone abnormality. IMPRESSION: No acute fracture. Electronically Signed   By: Marin Olp M.D.   On: 02/07/2018 19:06    Procedures Procedures (including critical care time)  Medications Ordered in ED Medications  acetaminophen (TYLENOL) tablet 1,000 mg (1,000 mg Oral Given 02/07/18 2044)     Initial Impression / Assessment and Plan / ED Course  I have reviewed the triage vital signs and the nursing notes.  Pertinent labs & imaging results that were available during my care of the patient were reviewed by me and considered in my medical decision making (see chart for details).     62 y.o. female who presents for evaluation after mechanical fall.  Reports she slipped on some water, causing her to fall backwards.  She thinks she twisted her right knee when she fell.  No head injury, LOC.  Reports pain to bilateral knees, right greater than left, left hip, left lower back.  Also reports some pain to the mid left upper arm.  She is not taking medication for pain.  She is not currently on blood thinners.  She denies any numbness/weakness.  No nausea/vomiting. Patient is afebrile, non-toxic appearing, sitting comfortably on examination table. Vital signs reviewed and stable.  Patient is neurovascularly intact.  On exam, she has point tenderness noted to the anterior aspect of right knee with overlying soft tissue swelling.  Additionally she has some bony  tenderness noted to the lower lumbar region that extends in the gluteus.  Consider fracture versus dislocation versus sprain versus musculoskeletal pain.  X-rays ordered at triage.  Patient reported some muscular tenderness noted to the mid upper arm.  No bony tenderness to left shoulder, elbow.  I offered to do x-rays but she declined at this time.  Given that she has no bony treatment of the shoulder, elbow is able to move the arm without any difficulty, I feel this  is reasonable.  Given reassuring physical exam and per [facility] CT criteria, no imaging is indicated at this time.   Lumbar spine x-ray negative for any acute bony abnormality's.  X-ray of left knee shows no evidence of acute bony abnormality.  Right knee shows no evidence of acute bony abnormality.  Hip x-ray negative for any acute bony abnormality.  No evidence of fracture or dislocation.  Discussed results with patient.  She does have a history of surgery to the right knee and reports she has torn tendons in the knee before.  Explained the x-ray which shows any bony abnormalities but does not reveal any ligament sprain, tendon injury.  We will plan to place her in a knee immobilizer and crutches.  We will give her outpatient orthopedic referral to follow-up if she does not improve in several weeks.  Patient was able to ambulate a few steps from the wheelchair into the examination bed without any difficulty.  She was able to bear weight without any difficulty.  Do not suspect occult fracture that would need further imaging.  Suspect most of her pain is musculoskeletal in nature.  Encourage at home supportive care measures. Patient had ample opportunity for questions and discussion. All patient's questions were answered with full understanding. Strict return precautions discussed. Patient expresses understanding and agreement to plan.    Final Clinical Impressions(s) / ED Diagnoses   Final diagnoses:  Sprain of right knee, unspecified  ligament, initial encounter  Acute left-sided low back pain, unspecified whether sciatica present  Left arm pain    ED Discharge Orders         Ordered    methocarbamol (ROBAXIN) 500 MG tablet  2 times daily     02/07/18 2054           Desma Mcgregor 02/07/18 2133    Quintella Reichert, MD 02/07/18 2358

## 2018-03-20 ENCOUNTER — Telehealth: Payer: Self-pay

## 2018-03-20 NOTE — Telephone Encounter (Signed)
Spoke with patient's daughter about scheduling the patient for a follow up with Dr Genevive Bi in February. She will need to reschedule her Chest CT that was ordered by Dr Ginette Pitman. This will need to be completed before her follow up visit with Dr Genevive Bi. She would like to see Dr Genevive Bi before her follow up appointment with Dr Ginette Pitman on 04/01/18. 03/25/18 would probably be the best day to see Dr Genevive Bi. She will call back once they have rescheduled the CT scan to schedule her follow up appointment with Dr Genevive Bi.

## 2018-04-08 NOTE — Telephone Encounter (Signed)
Patient's appointment with Dr Ginette Pitman is now scheduled for 05/02/18. I have left a message with her daughter, Ashika Apuzzo that the patient needs to reschedule her CT scan ordered by Dr Ginette Pitman and call our office to set up a follow up with Dr Genevive Bi.

## 2018-04-09 NOTE — Telephone Encounter (Signed)
[facility] called in regards about the patient, and was calling to find out when the patient had an appointment. Please call and advise the patient she needs an appointment schedule.

## 2018-04-14 ENCOUNTER — Telehealth: Payer: Self-pay | Admitting: *Deleted

## 2018-04-14 NOTE — Telephone Encounter (Signed)
Patient's daughter, Maxcine Ham, called the office asking about mom's appointment.   The patient is in February recalls.   Note to Caryl-Lyn to follow up with her daughter tomorrow about appointments.

## 2018-04-15 NOTE — Telephone Encounter (Signed)
I spoke with the patient's daughter and they will reschedule the patient's CT scan that was ordered by Dr Ginette Pitman. The will then call the office to schedule a follow up appointment with Dr Genevive Bi for after the CT scan.

## 2018-04-17 NOTE — Telephone Encounter (Signed)
Patient is scheduled for her CT scan on 04/29/18. She will follow up with Dr Genevive Bi on 05/02/18.

## 2018-04-18 NOTE — Telephone Encounter (Signed)
Dr Linton Ham office notified of follow up appointment with Dr Genevive Bi on 05/02/18.

## 2018-04-25 DIAGNOSIS — Z6835 Body mass index (BMI) 35.0-35.9, adult: Secondary | ICD-10-CM | POA: Diagnosis not present

## 2018-04-25 DIAGNOSIS — F17200 Nicotine dependence, unspecified, uncomplicated: Secondary | ICD-10-CM | POA: Diagnosis not present

## 2018-04-25 DIAGNOSIS — R1031 Right lower quadrant pain: Secondary | ICD-10-CM | POA: Diagnosis not present

## 2018-04-29 ENCOUNTER — Ambulatory Visit
Admission: RE | Admit: 2018-04-29 | Discharge: 2018-04-29 | Disposition: A | Discharge status: Home / Self Care | Payer: Commercial Managed Care - PPO | Source: Ambulatory Visit | Attending: Internal Medicine | Admitting: Internal Medicine

## 2018-04-29 DIAGNOSIS — R935 Abnormal findings on diagnostic imaging of other abdominal regions, including retroperitoneum: Secondary | ICD-10-CM

## 2018-04-29 DIAGNOSIS — R911 Solitary pulmonary nodule: Secondary | ICD-10-CM | POA: Insufficient documentation

## 2018-04-29 MED ORDER — IOHEXOL 300 MG/ML  SOLN
75.0000 mL | Freq: Once | INTRAMUSCULAR | Status: AC | PRN
  Administered 2018-04-29: 75 mL via INTRAVENOUS

## 2018-05-02 ENCOUNTER — Ambulatory Visit: Payer: Commercial Managed Care - PPO | Admitting: Cardiothoracic Surgery

## 2018-05-05 ENCOUNTER — Other Ambulatory Visit: Payer: Self-pay

## 2018-05-05 ENCOUNTER — Encounter: Payer: Self-pay | Admitting: Cardiothoracic Surgery

## 2018-05-05 ENCOUNTER — Ambulatory Visit (INDEPENDENT_AMBULATORY_CARE_PROVIDER_SITE_OTHER): Payer: Commercial Managed Care - PPO | Admitting: Cardiothoracic Surgery

## 2018-05-05 VITALS — BP 150/89 | HR 85 | Temp 97.8°F | Ht 64.0 in | Wt 200.0 lb

## 2018-05-05 DIAGNOSIS — J9859 Other diseases of mediastinum, not elsewhere classified: Secondary | ICD-10-CM

## 2018-05-05 NOTE — Patient Instructions (Signed)
Return in six month ct chest without. The patient is aware to call back for any questions or concerns.

## 2018-05-05 NOTE — Progress Notes (Signed)
  Patient ID: Laurie Gonzalez, female   DOB: 01/22/1956, 62 y.o.   MRN: [id number]  HISTORY: She returns today in follow-up.  She states through an interpreter that she has no complaints.  She gets short of breath whenever she walks up a flight of stairs but this is actually improved.  She denied any cough, fever or chills.  She denied any hemoptysis.  She did see Dr. Roque Lias last week who obtained some laboratory studies and she asked those results.  I did review them and they were essentially within normal limits.  Her TSH was slightly elevated.  She did have a chest CT scan made last week.   Vitals:   05/05/18 1058  BP: (!) 150/89  Pulse: 85  Temp: 97.8 F (36.6 C)  SpO2: 98%     EXAM:    Resp: Lungs are clear bilaterally.  No respiratory distress, normal effort. Heart:  Regular without murmurs Abd:  Abdomen is soft, non distended and non tender. No masses are palpable.  There is no rebound and no guarding.  Neurological: Alert and oriented to person, place, and time. Coordination normal.  Skin: Skin is warm and dry. No rash noted. No diaphoretic. No erythema. No pallor.  Psychiatric: Normal mood and affect. Normal behavior. Judgment and thought content normal.    ASSESSMENT: I have independently reviewed the chest CT.  There is a 2.5 cm mass within the right chest just posterior to the left atrium.  This is unchanged from October.   PLAN:   I would like to bring her back again in 6 months time to repeat her chest CT.  We did discuss smoking cessation as well.  All of her questions were answered.    Nestor Lewandowsky, MD

## 2018-05-12 ENCOUNTER — Other Ambulatory Visit: Payer: Self-pay | Admitting: Internal Medicine

## 2018-05-12 ENCOUNTER — Other Ambulatory Visit (HOSPITAL_COMMUNITY): Payer: Self-pay | Admitting: Internal Medicine

## 2018-05-12 DIAGNOSIS — R911 Solitary pulmonary nodule: Secondary | ICD-10-CM

## 2018-05-19 DIAGNOSIS — H109 Unspecified conjunctivitis: Secondary | ICD-10-CM | POA: Diagnosis not present

## 2018-05-19 DIAGNOSIS — I5189 Other ill-defined heart diseases: Secondary | ICD-10-CM | POA: Diagnosis not present

## 2018-05-19 DIAGNOSIS — Z23 Encounter for immunization: Secondary | ICD-10-CM | POA: Diagnosis not present

## 2018-05-19 DIAGNOSIS — R21 Rash and other nonspecific skin eruption: Secondary | ICD-10-CM | POA: Diagnosis not present

## 2018-08-26 ENCOUNTER — Encounter: Payer: Self-pay | Admitting: Emergency Medicine

## 2018-08-26 ENCOUNTER — Emergency Department
Admission: EM | Admit: 2018-08-26 | Discharge: 2018-08-26 | Disposition: A | Discharge status: Home / Self Care | Payer: Commercial Managed Care - PPO | Attending: Emergency Medicine | Admitting: Emergency Medicine

## 2018-08-26 ENCOUNTER — Other Ambulatory Visit: Payer: Self-pay

## 2018-08-26 DIAGNOSIS — Z5321 Procedure and treatment not carried out due to patient leaving prior to being seen by health care provider: Secondary | ICD-10-CM | POA: Diagnosis not present

## 2018-08-26 DIAGNOSIS — R079 Chest pain, unspecified: Secondary | ICD-10-CM | POA: Diagnosis present

## 2018-08-26 LAB — BASIC METABOLIC PANEL
Anion gap: 9 (ref 5–15)
BUN: 18 mg/dL (ref 8–23)
CO2: 23 mmol/L (ref 22–32)
Calcium: 9.3 mg/dL (ref 8.9–10.3)
Chloride: 107 mmol/L (ref 98–111)
Creatinine, Ser: 0.56 mg/dL (ref 0.44–1.00)
GFR calc Af Amer: >60 mL/min (ref >60)
GFR calc non Af Amer: >60 mL/min (ref >60)
Glucose, Bld: 114 mg/dL — ABNORMAL HIGH (ref 70–99)
Potassium: 3.6 mmol/L (ref 3.5–5.1)
Sodium: 139 mmol/L (ref 135–145)

## 2018-08-26 LAB — CBC
HCT: 37.6 % (ref 36.0–46.0)
Hemoglobin: 12.4 g/dL (ref 12.0–15.0)
MCH: 30 pg (ref 26.0–34.0)
MCHC: 33 g/dL (ref 30.0–36.0)
MCV: 90.8 fL (ref 80.0–100.0)
Platelets: 301 10*3/uL (ref 150–400)
RBC: 4.14 MIL/uL (ref 3.87–5.11)
RDW: 13 % (ref 11.5–15.5)
WBC: 9.3 10*3/uL (ref 4.0–10.5)
nRBC: 0 % (ref 0.0–0.2)

## 2018-08-26 LAB — TROPONIN I: Troponin I: <0.03 ng/mL (ref <0.03)

## 2018-08-26 NOTE — ED Triage Notes (Signed)
Pt presents to ED with c/o worsening left sided chest pain. Pt reports her symptoms started Sunday around 2pm. Pain is described as sharp. Denies nausea.

## 2018-08-28 ENCOUNTER — Telehealth: Payer: Self-pay | Admitting: Emergency Medicine

## 2018-08-28 NOTE — Telephone Encounter (Signed)
Called patient due to lwot to inquire about condition and follow up plans.  No answer and no voicemail  

## 2018-10-16 ENCOUNTER — Other Ambulatory Visit: Payer: Self-pay

## 2018-10-16 DIAGNOSIS — J9859 Other diseases of mediastinum, not elsewhere classified: Secondary | ICD-10-CM

## 2018-10-16 NOTE — Addendum Note (Signed)
Addended by: Lesly Rubenstein on: 10/16/2018 11:04 AM   Modules accepted: Orders

## 2018-10-22 ENCOUNTER — Ambulatory Visit: Payer: Commercial Managed Care - PPO

## 2018-10-28 ENCOUNTER — Ambulatory Visit: Payer: Commercial Managed Care - PPO

## 2018-11-03 ENCOUNTER — Ambulatory Visit: Admission: RE | Admit: 2018-11-03 | Payer: Commercial Managed Care - PPO | Source: Ambulatory Visit

## 2018-11-04 ENCOUNTER — Telehealth: Payer: Self-pay

## 2018-11-04 NOTE — Telephone Encounter (Signed)
Spoke with daughter Alycen regarding no show to CT appointment. Daughter was provided number to reschedule CT and to call office with appointment information. Appointment with Dr.Oaks will need to be rescheduled if CT not done by  11/07/2018.

## 2018-11-07 ENCOUNTER — Ambulatory Visit: Payer: Commercial Managed Care - PPO | Admitting: Cardiothoracic Surgery

## 2018-12-03 ENCOUNTER — Ambulatory Visit
Admission: RE | Admit: 2018-12-03 | Discharge: 2018-12-03 | Disposition: A | Discharge status: Home / Self Care | Payer: Commercial Managed Care - PPO | Source: Ambulatory Visit | Attending: Internal Medicine | Admitting: Internal Medicine

## 2018-12-03 ENCOUNTER — Other Ambulatory Visit: Payer: Self-pay

## 2018-12-03 DIAGNOSIS — R911 Solitary pulmonary nodule: Secondary | ICD-10-CM | POA: Diagnosis present

## 2018-12-03 LAB — POCT I-STAT CREATININE: Creatinine, Ser: 0.5 mg/dL (ref 0.44–1.00)

## 2018-12-03 MED ORDER — IOHEXOL 300 MG/ML  SOLN
75.0000 mL | Freq: Once | INTRAMUSCULAR | Status: AC | PRN
  Administered 2018-12-03: 75 mL via INTRAVENOUS

## 2018-12-11 ENCOUNTER — Ambulatory Visit: Payer: Commercial Managed Care - PPO | Admitting: Cardiothoracic Surgery

## 2019-01-03 ENCOUNTER — Emergency Department (HOSPITAL_COMMUNITY): Payer: Commercial Managed Care - PPO

## 2019-01-03 ENCOUNTER — Other Ambulatory Visit: Payer: Self-pay

## 2019-01-03 ENCOUNTER — Emergency Department (HOSPITAL_COMMUNITY)
Admission: EM | Admit: 2019-01-03 | Discharge: 2019-01-03 | Disposition: A | Discharge status: Home / Self Care | Payer: Commercial Managed Care - PPO | Attending: Emergency Medicine | Admitting: Emergency Medicine

## 2019-01-03 ENCOUNTER — Encounter (HOSPITAL_COMMUNITY): Payer: Self-pay | Admitting: Emergency Medicine

## 2019-01-03 DIAGNOSIS — Z79899 Other long term (current) drug therapy: Secondary | ICD-10-CM | POA: Insufficient documentation

## 2019-01-03 DIAGNOSIS — Z87891 Personal history of nicotine dependence: Secondary | ICD-10-CM | POA: Insufficient documentation

## 2019-01-03 DIAGNOSIS — G510 Bell's palsy: Secondary | ICD-10-CM | POA: Diagnosis not present

## 2019-01-03 DIAGNOSIS — R29810 Facial weakness: Secondary | ICD-10-CM | POA: Diagnosis present

## 2019-01-03 LAB — CBC WITH DIFFERENTIAL/PLATELET
Abs Immature Granulocytes: 0.02 10*3/uL (ref 0.00–0.07)
Basophils Absolute: 0 10*3/uL (ref 0.0–0.1)
Basophils Relative: 1 %
Eosinophils Absolute: 0.1 10*3/uL (ref 0.0–0.5)
Eosinophils Relative: 2 %
HCT: 37.9 % (ref 36.0–46.0)
Hemoglobin: 12.3 g/dL (ref 12.0–15.0)
Immature Granulocytes: 0 %
Lymphocytes Relative: 42 %
Lymphs Abs: 2.9 10*3/uL (ref 0.7–4.0)
MCH: 30.4 pg (ref 26.0–34.0)
MCHC: 32.5 g/dL (ref 30.0–36.0)
MCV: 93.6 fL (ref 80.0–100.0)
Monocytes Absolute: 0.7 10*3/uL (ref 0.1–1.0)
Monocytes Relative: 10 %
Neutro Abs: 3.2 10*3/uL (ref 1.7–7.7)
Neutrophils Relative %: 45 %
Platelets: 361 10*3/uL (ref 150–400)
RBC: 4.05 MIL/uL (ref 3.87–5.11)
RDW: 12.8 % (ref 11.5–15.5)
WBC: 7 10*3/uL (ref 4.0–10.5)
nRBC: 0 % (ref 0.0–0.2)

## 2019-01-03 LAB — COMPREHENSIVE METABOLIC PANEL
ALT: 30 U/L (ref 0–44)
AST: 25 U/L (ref 15–41)
Albumin: 3.9 g/dL (ref 3.5–5.0)
Alkaline Phosphatase: 59 U/L (ref 38–126)
Anion gap: 7 (ref 5–15)
BUN: 15 mg/dL (ref 8–23)
CO2: 25 mmol/L (ref 22–32)
Calcium: 8.8 mg/dL — ABNORMAL LOW (ref 8.9–10.3)
Chloride: 105 mmol/L (ref 98–111)
Creatinine, Ser: 0.46 mg/dL (ref 0.44–1.00)
GFR calc Af Amer: >60 mL/min (ref >60)
GFR calc non Af Amer: >60 mL/min (ref >60)
Glucose, Bld: 101 mg/dL — ABNORMAL HIGH (ref 70–99)
Potassium: 4.2 mmol/L (ref 3.5–5.1)
Sodium: 137 mmol/L (ref 135–145)
Total Bilirubin: 0.7 mg/dL (ref 0.3–1.2)
Total Protein: 7.2 g/dL (ref 6.5–8.1)

## 2019-01-03 MED ORDER — PREDNISONE 20 MG PO TABS: ORAL_TABLET | ORAL | 0 refills | Status: DC

## 2019-01-03 MED ORDER — PREDNISONE 50 MG PO TABS
60.0000 mg | ORAL_TABLET | Freq: Once | ORAL | Status: AC
  Administered 2019-01-03: 60 mg via ORAL
  Filled 2019-01-03: qty 1

## 2019-01-03 MED ORDER — VALACYCLOVIR HCL 1 G PO TABS: 1000.0000 mg | ORAL_TABLET | Freq: Three times a day (TID) | ORAL | 0 refills | Status: DC

## 2019-01-03 MED ORDER — HYPROMELLOSE (GONIOSCOPIC) 2.5 % OP SOLN: 1.0000 [drp] | OPHTHALMIC | 12 refills | Status: AC | PRN

## 2019-01-03 MED ORDER — ARTIFICIAL TEARS OPHTHALMIC OINT: TOPICAL_OINTMENT | OPHTHALMIC | 1 refills | Status: AC

## 2019-01-03 MED ORDER — VALACYCLOVIR HCL 500 MG PO TABS
1000.0000 mg | ORAL_TABLET | Freq: Once | ORAL | Status: AC
  Administered 2019-01-03: 1000 mg via ORAL
  Filled 2019-01-03: qty 2

## 2019-01-03 MED ORDER — VALACYCLOVIR HCL 1 G PO TABS: ORAL_TABLET | ORAL | 0 refills | Status: AC

## 2019-01-03 NOTE — ED Triage Notes (Addendum)
Patient c/o leftt side facial droop. Per patient started as pain in left ear x1 week ago and started radiating into left jaw. Patient states that she started having numbness in left side of face and drooping of the eye and mouth. Denies any slurred speech or weakness in extremities. Per patient headaches.

## 2019-01-03 NOTE — Discharge Instructions (Addendum)
Follow-up with Dr. Merlene Laughter in 1-2 weeks

## 2019-01-03 NOTE — ED Provider Notes (Signed)
[facility] EMERGENCY DEPARTMENT Provider Note   CSN: [id number] Arrival date & time: 01/03/19  1306     History   Chief Complaint Chief Complaint  Patient presents with   Facial Droop    HPI Laurie Gonzalez is a 63 y.o. female.     Patient complains of numbness to left side of her face has been going on for a few days.  Also she complains of left ear pain  The history is provided by the patient. No language interpreter was used.  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/06/2017    Past Surgical History:  Procedure Laterality Date   ABDOMINAL HYSTERECTOMY     COLONOSCOPY WITH PROPOFOL N/A 05/13/2015   Procedure: COLONOSCOPY WITH PROPOFOL;  Surgeon: Manya Silvas, MD;  Location: [facility] ENDOSCOPY;  Service: Endoscopy;  Laterality: N/A;   ESOPHAGOGASTRODUODENOSCOPY (EGD) WITH PROPOFOL N/A 05/13/2015   Procedure: ESOPHAGOGASTRODUODENOSCOPY (EGD) WITH PROPOFOL;  Surgeon: Manya Silvas, MD;  Location: [facility] ENDOSCOPY;  Service: Endoscopy;  Laterality: N/A;   KNEE ARTHROSCOPY     KNEE ARTHROSCOPY Right 06/26/2017   Procedure: ARTHROSCOPY KNEE WITH DEBRIDEMENT AND REPAIR VS PARTIAL MEDIAL MENISCECTOMY;  Surgeon: Corky Mull, MD;  Location: [facility];  Service: Orthopedics;  Laterality: Right;     OB History   No obstetric history on file.      Home Medications    Prior to Admission medications    Medication Sig Start Date End Date Taking? Authorizing Provider  artificial tears (LACRILUBE) OINT ophthalmic ointment Use in left eye and tape shut at night 01/03/19   Milton Ferguson, MD  hydroxypropyl methylcellulose / hypromellose (ISOPTO TEARS / GONIOVISC) 2.5 % ophthalmic solution Place 1 drop into the left eye as needed for dry eyes. 01/03/19   Milton Ferguson, MD  predniSONE (DELTASONE) 20 MG tablet Take 3 tablets a day 01/03/19   Milton Ferguson, MD  valACYclovir (VALTREX) 1000 MG tablet Take 1 tablet (1,000 mg total) by mouth 3 (three) times daily for 14 days. 01/03/19 01/17/19  Milton Ferguson, MD    Family History No family history on file.  Social History Social History   Tobacco Use   Smoking status: Former Smoker    Packs/day: 0.25    Years: 43.00    Pack years: 10.75    Types: Cigarettes   Smokeless tobacco: Never Used   Tobacco comment: since age 58  Substance Use Topics   Alcohol use: Yes    Comment: Holidays   Drug use: No     Allergies   Patient has no known allergies.   Review of Systems Review of Systems  Constitutional: Negative for appetite change and fatigue.  HENT: Negative for congestion, ear discharge and sinus pressure.        Facial numbness  Eyes: Negative for discharge.  Respiratory: Negative for cough.   Cardiovascular: Negative for chest pain.  Gastrointestinal: Negative for  abdominal pain and diarrhea.  Genitourinary: Negative for frequency and hematuria.  Musculoskeletal: Negative for back pain.  Skin: Negative for rash.  Neurological: Positive for weakness. Negative for seizures and headaches.  Psychiatric/Behavioral: Negative for hallucinations.     Physical Exam Updated Vital Signs BP 113/74    Pulse (!) 58    Temp 98.2 F (36.8 C)    Resp 16    Ht 5\' 2"  (1.575 m)    Wt 85.3 kg    SpO2 93%    BMI 34.39 kg/m   Physical Exam Vitals signs and nursing note reviewed.  Constitutional:      Appearance: She is well-developed.   HENT:     Head: Normocephalic.     Nose: Nose normal.  Eyes:     General: No scleral icterus.    Conjunctiva/sclera: Conjunctivae normal.  Neck:     Musculoskeletal: Neck supple.     Thyroid: No thyromegaly.  Cardiovascular:     Rate and Rhythm: Normal rate and regular rhythm.     Heart sounds: No murmur. No friction rub. No gallop.   Pulmonary:     Breath sounds: No stridor. No wheezing or rales.  Chest:     Chest wall: No tenderness.  Abdominal:     General: There is no distension.     Tenderness: There is no abdominal tenderness. There is no rebound.  Musculoskeletal: Normal range of motion.  Lymphadenopathy:     Cervical: No cervical adenopathy.  Skin:    Findings: No erythema or rash.  Neurological:     Mental Status: She is oriented to person, place, and time.     Motor: No abnormal muscle tone.     Coordination: Coordination normal.     Comments: Patient has weakness to left forehead and left side of face.  Consistent with Bell's palsy  Psychiatric:        Behavior: Behavior normal.      ED Treatments / Results  Labs (all labs ordered are listed, but only abnormal results are displayed) Labs Reviewed  COMPREHENSIVE METABOLIC PANEL - Abnormal; Notable for the following components:      Result Value   Glucose, Bld 101 (*)    Calcium 8.8 (*)    All other components within normal limits  CBC WITH DIFFERENTIAL/PLATELET    EKG EKG Interpretation  Date/Time:  Saturday January 03 2019 15:10:20 EDT Ventricular Rate:  59 PR Interval:    QRS Duration: 87 QT Interval:  415 QTC Calculation: 412 R Axis:   68 Text Interpretation:  Sinus rhythm since last tracing no significant change Confirmed by Daleen Bo [phone]) on 01/03/2019 3:15:51 PM   Radiology Dg Chest 2 View  Result Date: 01/03/2019 CLINICAL DATA:  Known lung nodule EXAM: CHEST - 2 VIEW COMPARISON:  CT 12/03/2018, radiograph 03/28/2011 FINDINGS: Nodule visualized on prior CT abuts the right  atrium on that exam and while there is fullness posterior to the left atrium on the lateral radiograph, an accurate comparison is not possible due to differences in technique. No consolidation, features of edema, pneumothorax, or effusion. Pulmonary vascularity is normally distributed. The cardiomediastinal contours are unremarkable. No acute osseous or soft tissue abnormality. IMPRESSION: Contour abnormality along the posterior heart border on lateral radiograph may correspond to the nodule seen on prior CT study however evaluation is limited on this radiographic exam. If further evaluation is clinically warranted, cross-sectional imaging should be obtained. Otherwise unremarkable chest radiograph. Electronically Signed   By: Elwin Sleight.D.  On: 01/03/2019 15:18   Ct Head Wo Contrast  Result Date: 01/03/2019 CLINICAL DATA:  Focal neuro deficit greater than 6 hours, stroke suspected, left-sided facial droop EXAM: CT HEAD WITHOUT CONTRAST TECHNIQUE: Contiguous axial images were obtained from the base of the skull through the vertex without intravenous contrast. COMPARISON:  CT 03/28/2011 FINDINGS: Brain: No evidence of acute infarction, hemorrhage, hydrocephalus, extra-axial collection or mass lesion/mass effect. Symmetric prominence of the ventricles, cisterns and sulci compatible with mild parenchymal volume loss. Vascular: No hyperdense vessel or unexpected calcification. Skull: No calvarial fracture or suspicious osseous lesion. No scalp swelling or hematoma. Sinuses/Orbits: Paranasal sinuses and mastoid air cells are predominantly clear. Included orbital structures are unremarkable. Other: Streak artifact is seen across the skull best resulting in some asymmetric attenuation of the temporal lobes. IMPRESSION: No convincing CT evidence of acute or subacute infarction. If there is persisting clinical concern for ischemia, consider MRI. Electronically Signed   By: Lovena Le M.D.   On: 01/03/2019 15:21      Procedures Procedures (including critical care time)  Medications Ordered in ED Medications  predniSONE (DELTASONE) tablet 60 mg (has no administration in time range)  valACYclovir (VALTREX) tablet 1,000 mg (has no administration in time range)     Initial Impression / Assessment and Plan / ED Course  I have reviewed the triage vital signs and the nursing notes.  Pertinent labs & imaging results that were available during my care of the patient were reviewed by me and considered in my medical decision making (see chart for details).       Labs and CT scan negative.  Patient will be treated for Bell's palsy with Valtrex prednisone artificial tears and Lacri-Lube ointment.  She will follow-up with neurology Dr. Merlene Laughter  Final Clinical Impressions(s) / ED Diagnoses   Final diagnoses:  Bell's palsy    ED Discharge Orders         Ordered    valACYclovir (VALTREX) 1000 MG tablet  3 times daily     01/03/19 1640    predniSONE (DELTASONE) 20 MG tablet     01/03/19 1640    artificial tears (LACRILUBE) OINT ophthalmic ointment     01/03/19 1640    hydroxypropyl methylcellulose / hypromellose (ISOPTO TEARS / GONIOVISC) 2.5 % ophthalmic solution  As needed     01/03/19 1640           Milton Ferguson, MD 01/03/19 1646

## 2019-01-13 DIAGNOSIS — G510 Bell's palsy: Secondary | ICD-10-CM | POA: Insufficient documentation

## 2019-01-21 ENCOUNTER — Other Ambulatory Visit: Payer: Self-pay | Admitting: Internal Medicine

## 2019-01-21 DIAGNOSIS — Z1231 Encounter for screening mammogram for malignant neoplasm of breast: Secondary | ICD-10-CM

## 2019-01-22 ENCOUNTER — Other Ambulatory Visit: Payer: Self-pay | Admitting: Internal Medicine

## 2019-01-22 ENCOUNTER — Other Ambulatory Visit (HOSPITAL_COMMUNITY): Payer: Self-pay | Admitting: Internal Medicine

## 2019-01-22 DIAGNOSIS — I5189 Other ill-defined heart diseases: Secondary | ICD-10-CM

## 2019-01-22 DIAGNOSIS — R519 Headache, unspecified: Secondary | ICD-10-CM

## 2019-01-22 DIAGNOSIS — G510 Bell's palsy: Secondary | ICD-10-CM

## 2019-01-22 DIAGNOSIS — F331 Major depressive disorder, recurrent, moderate: Secondary | ICD-10-CM

## 2019-01-28 ENCOUNTER — Other Ambulatory Visit: Payer: Self-pay

## 2019-01-28 ENCOUNTER — Ambulatory Visit
Admission: RE | Admit: 2019-01-28 | Discharge: 2019-01-28 | Disposition: A | Discharge status: Home / Self Care | Payer: Commercial Managed Care - PPO | Source: Ambulatory Visit | Attending: Internal Medicine | Admitting: Internal Medicine

## 2019-01-28 DIAGNOSIS — G510 Bell's palsy: Secondary | ICD-10-CM | POA: Diagnosis not present

## 2019-01-28 DIAGNOSIS — R519 Headache, unspecified: Secondary | ICD-10-CM | POA: Diagnosis present

## 2019-01-28 DIAGNOSIS — F331 Major depressive disorder, recurrent, moderate: Secondary | ICD-10-CM | POA: Insufficient documentation

## 2019-02-25 ENCOUNTER — Encounter: Payer: Self-pay | Admitting: *Deleted

## 2019-05-06 ENCOUNTER — Ambulatory Visit
Admission: RE | Admit: 2019-05-06 | Discharge: 2019-05-06 | Disposition: A | Discharge status: Home / Self Care | Payer: Commercial Managed Care - PPO | Source: Ambulatory Visit | Attending: Internal Medicine | Admitting: Internal Medicine

## 2019-05-06 DIAGNOSIS — Z1231 Encounter for screening mammogram for malignant neoplasm of breast: Secondary | ICD-10-CM | POA: Insufficient documentation

## 2019-11-23 ENCOUNTER — Other Ambulatory Visit: Payer: Self-pay | Admitting: Internal Medicine

## 2019-11-23 DIAGNOSIS — I5189 Other ill-defined heart diseases: Secondary | ICD-10-CM

## 2019-12-11 ENCOUNTER — Other Ambulatory Visit: Payer: Self-pay

## 2019-12-11 ENCOUNTER — Ambulatory Visit
Admission: RE | Admit: 2019-12-11 | Discharge: 2019-12-11 | Disposition: A | Discharge status: Home / Self Care | Payer: Commercial Managed Care - PPO | Source: Ambulatory Visit | Attending: Internal Medicine | Admitting: Internal Medicine

## 2019-12-11 DIAGNOSIS — I5189 Other ill-defined heart diseases: Secondary | ICD-10-CM | POA: Insufficient documentation

## 2019-12-11 LAB — POCT I-STAT CREATININE: Creatinine, Ser: 0.5 mg/dL (ref 0.44–1.00)

## 2019-12-11 MED ORDER — IOHEXOL 300 MG/ML  SOLN
75.0000 mL | Freq: Once | INTRAMUSCULAR | Status: AC | PRN
  Administered 2019-12-11: 75 mL via INTRAVENOUS

## 2020-01-10 IMAGING — CT CT HEAD W/O CM
3 series · 16 of 47 positions shown, 19 images · non-contrast
Comparison: CT 03/28/2011

CLINICAL DATA: Focal neuro deficit greater than 6 hours, stroke
suspected, left-sided facial droop

EXAM:
CT HEAD WITHOUT CONTRAST
TECHNIQUE: Contiguous axial images were obtained from the base of the skull
through the vertex without intravenous contrast.

[Series 2: head w o · axial · 0.44mm/px · z∈[+104,+229]mm · 10 of 31 slices shown, 13 images]
[im 3/31  brain]
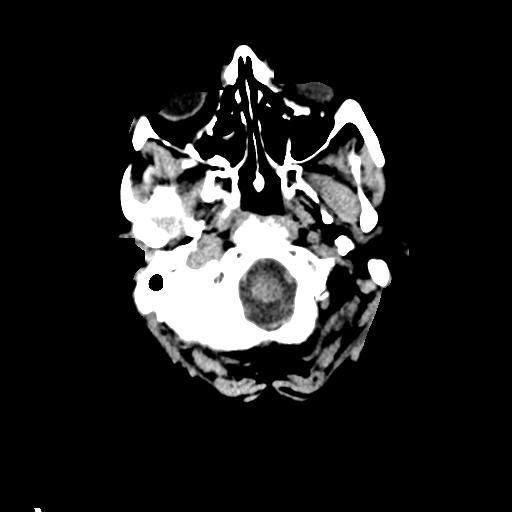
[im 3/31  bone]
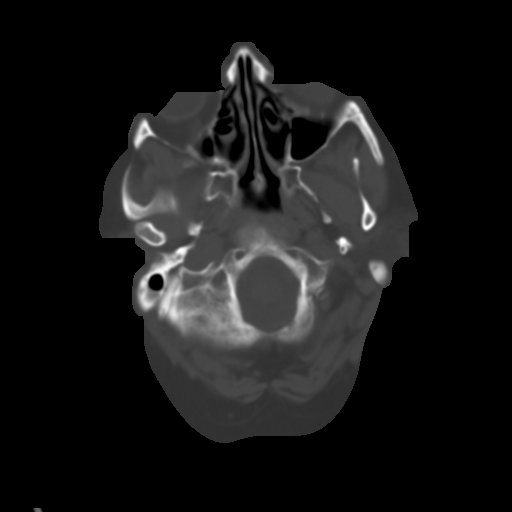
[im 6/31  brain]
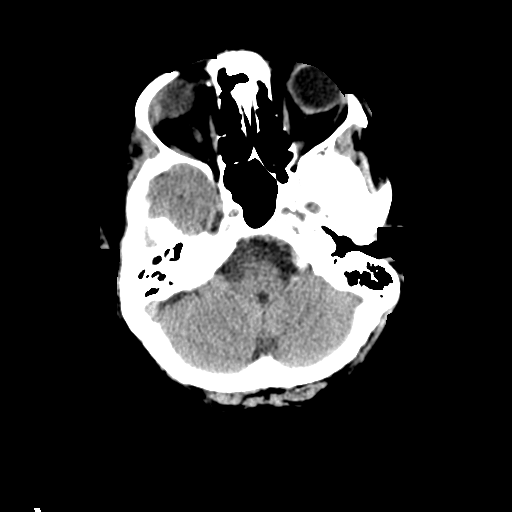
[im 9/31  brain]
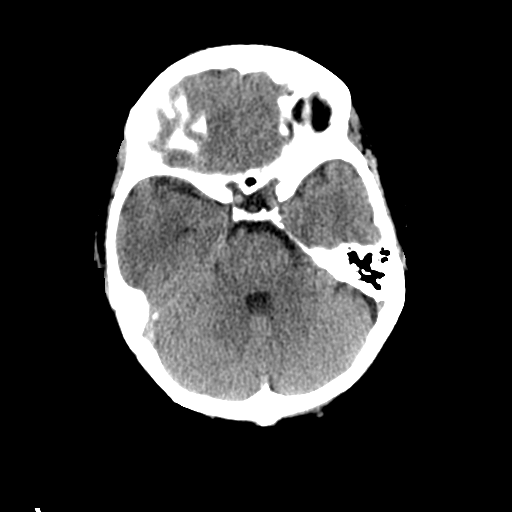
[im 11/31  brain]
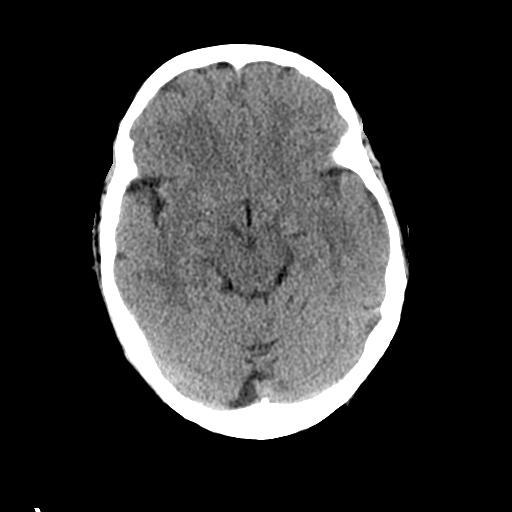
[im 14/31  brain]
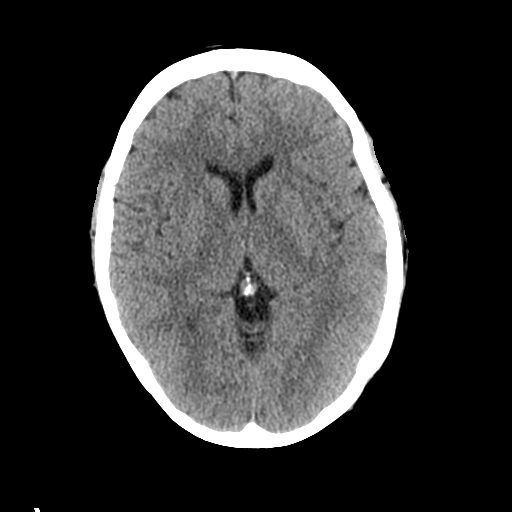
[im 14/31  bone]
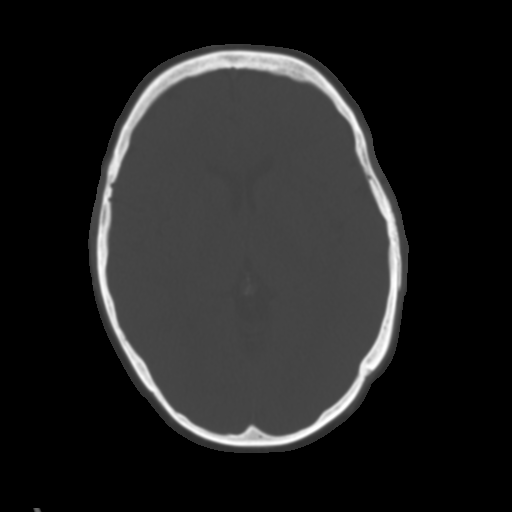
[im 17/31  brain]
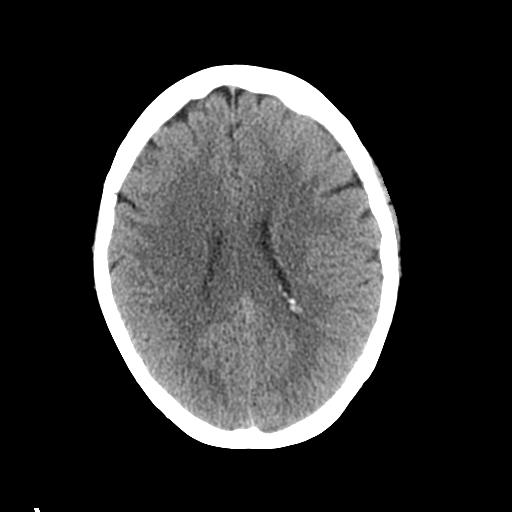
[im 20/31  brain]
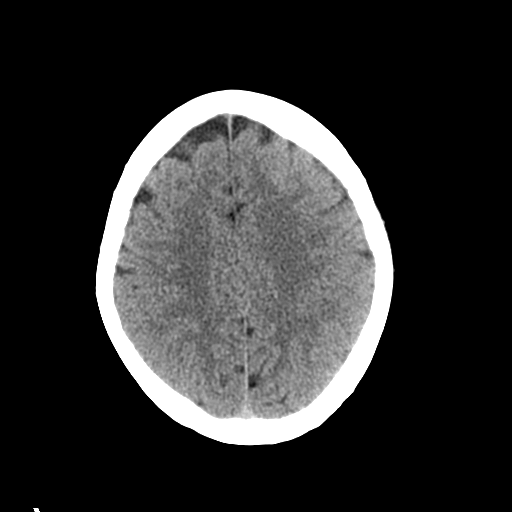
[im 23/31  brain]
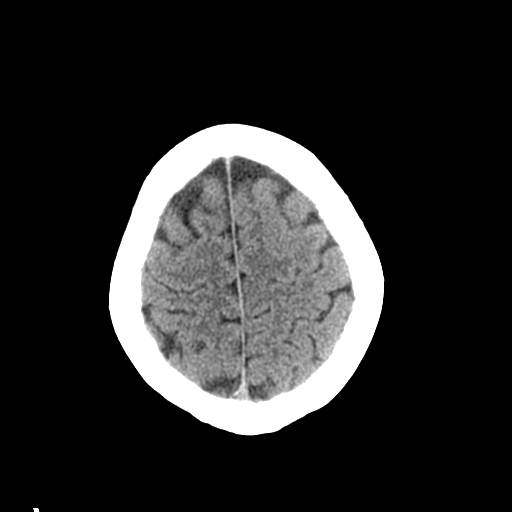
[im 25/31  brain]
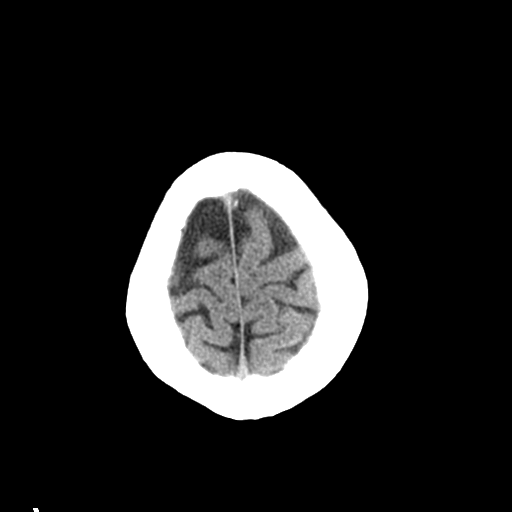
[im 25/31  bone]
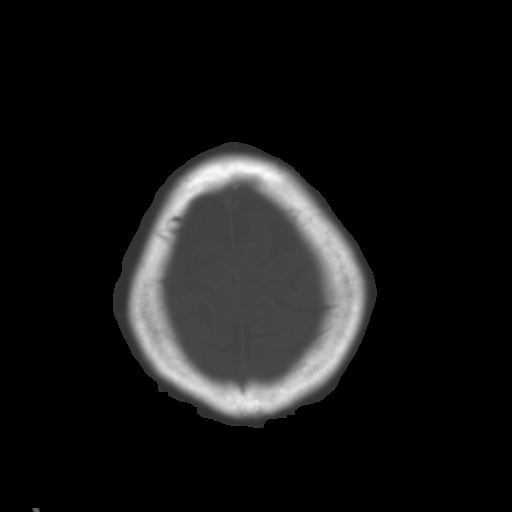
[im 28/31  brain]
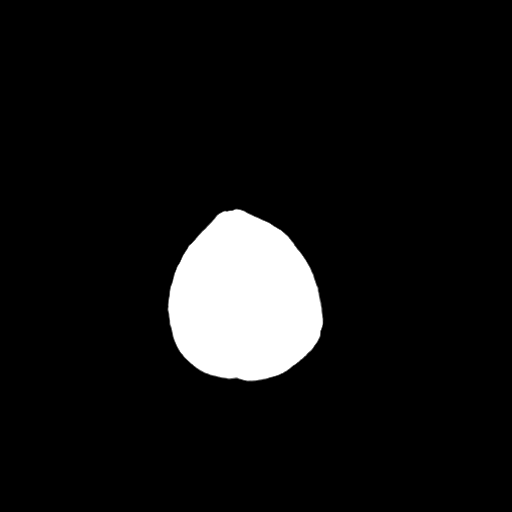

[Series 4: coronal soft · coronal · 0.34mm/px · 3 of 66 slices shown]
[im 22/66  brain]
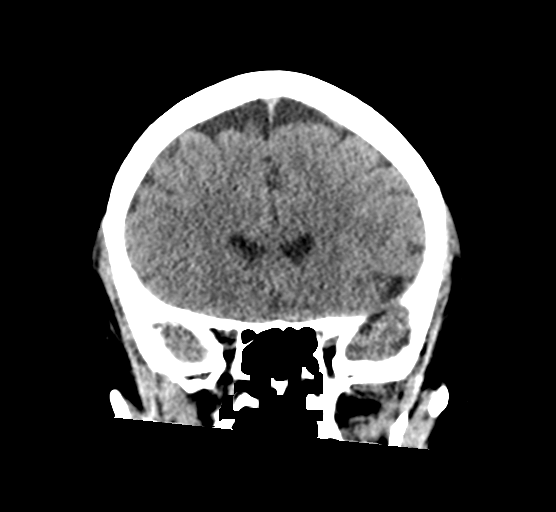
[im 29/66  brain]
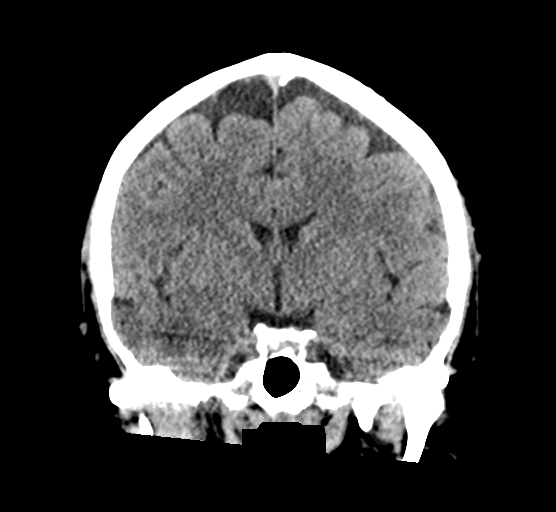
[im 37/66  brain]
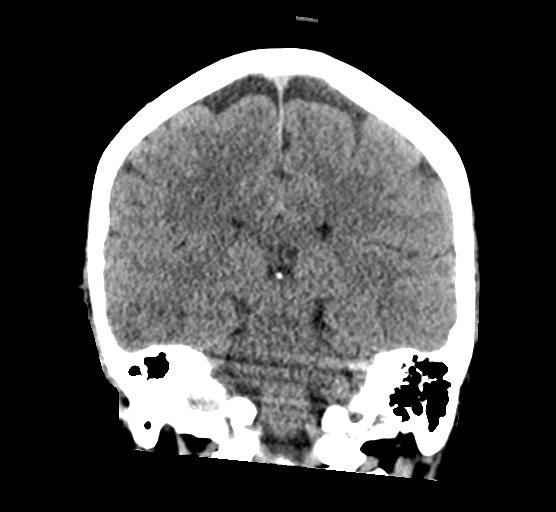

[Series 5: sagittal soft · sagittal · 0.35mm/px · 3 of 63 slices shown]
[im 21/63  brain]
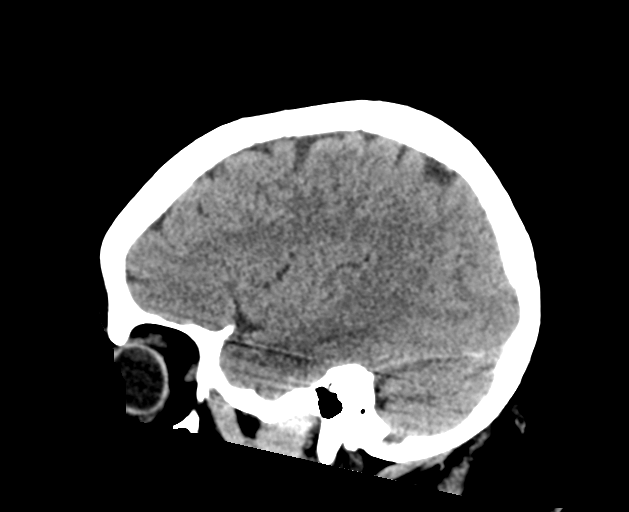
[im 32/63  brain]
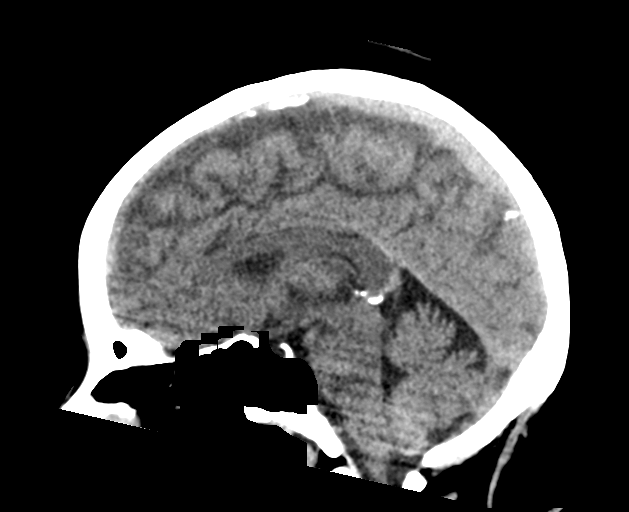
[im 42/63  brain]
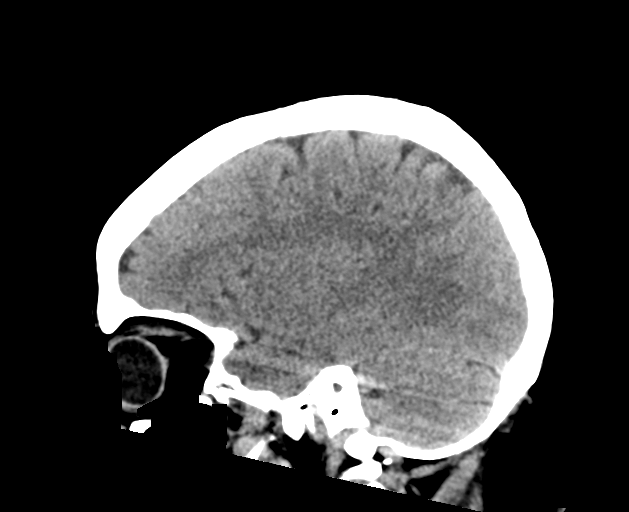

[16 of 47 positions shown; findings below may reference images not displayed]

FINDINGS: Brain: No evidence of acute infarction, hemorrhage, hydrocephalus,
extra-axial collection or mass lesion/mass effect. Symmetric
prominence of the ventricles, cisterns and sulci compatible with
mild parenchymal volume loss.

Vascular: No hyperdense vessel or unexpected calcification.

Skull: No calvarial fracture or suspicious osseous lesion. No scalp
swelling or hematoma.

Sinuses/Orbits: Paranasal sinuses and mastoid air cells are
predominantly clear. Included orbital structures are unremarkable.

Other: Streak artifact is seen across the skull best resulting in
some asymmetric attenuation of the temporal lobes.
IMPRESSION: No convincing CT evidence of acute or subacute infarction. If there
is persisting clinical concern for ischemia, consider MRI.

## 2020-04-11 ENCOUNTER — Other Ambulatory Visit (INDEPENDENT_AMBULATORY_CARE_PROVIDER_SITE_OTHER): Payer: Self-pay | Admitting: Nurse Practitioner

## 2020-04-11 DIAGNOSIS — I83819 Varicose veins of unspecified lower extremities with pain: Secondary | ICD-10-CM

## 2020-04-13 ENCOUNTER — Encounter (INDEPENDENT_AMBULATORY_CARE_PROVIDER_SITE_OTHER): Payer: Self-pay | Admitting: Nurse Practitioner

## 2020-04-13 ENCOUNTER — Ambulatory Visit (INDEPENDENT_AMBULATORY_CARE_PROVIDER_SITE_OTHER): Payer: Commercial Managed Care - PPO

## 2020-04-13 ENCOUNTER — Ambulatory Visit (INDEPENDENT_AMBULATORY_CARE_PROVIDER_SITE_OTHER): Payer: Commercial Managed Care - PPO | Admitting: Nurse Practitioner

## 2020-04-13 ENCOUNTER — Other Ambulatory Visit: Payer: Self-pay

## 2020-04-13 VITALS — BP 101/68 | HR 76 | Ht 65.0 in | Wt 199.0 lb

## 2020-04-13 DIAGNOSIS — L299 Pruritus, unspecified: Secondary | ICD-10-CM

## 2020-04-13 DIAGNOSIS — I83819 Varicose veins of unspecified lower extremities with pain: Secondary | ICD-10-CM

## 2020-04-13 DIAGNOSIS — F17200 Nicotine dependence, unspecified, uncomplicated: Secondary | ICD-10-CM

## 2020-04-13 MED ORDER — HYDROXYZINE HCL 10 MG PO TABS: 10.0000 mg | ORAL_TABLET | Freq: Every evening | ORAL | 3 refills | Status: AC | PRN

## 2020-04-13 MED ORDER — LORATADINE 10 MG PO TABS: 10.0000 mg | ORAL_TABLET | Freq: Every day | ORAL | 6 refills | Status: AC

## 2020-04-24 ENCOUNTER — Encounter (INDEPENDENT_AMBULATORY_CARE_PROVIDER_SITE_OTHER): Payer: Self-pay | Admitting: Nurse Practitioner

## 2020-04-24 NOTE — Progress Notes (Signed)
Subjective:    Patient ID: Laurie Gonzalez, female    DOB: [birth year], 65 y.o.   MRN: [id number] No chief complaint on file.   Laurie Gonzalez is a 65 year old female that presents today for evaluation of painful itchy varicosities.  The patient notes that she has severe itching over the areas of her varicosities on her lower legs.  The patient notes that the itching tends to be worse post shower.  She also notes discoloration after shower as well.  She does endorse that sometimes itching actually occurs widespread and it is intense in nature.  She has not tried any medications were prescribed or over-the-counter to help this itching.  She denies any fever, chills, nausea, vomiting or diarrhea.  Today noninvasive studies show evidence of reflux in the right common femoral vein as well as reflux in the left and the great saphenous vein at the saphenofemoral junction.  There is no evidence of DVT bilaterally.  No evidence of superficial venous reflux seen in the right lower extremity.  No evidence of deep venous insufficiency seen in the left lower extremity.  No evidence of superficial thrombophlebitis seen bilaterally.   Review of Systems  Skin:       Itching  Hematological: Bruises/bleeds easily.  All other systems reviewed and are negative.      Objective:   Physical Exam Vitals reviewed.  HENT:     Head: Normocephalic.  Cardiovascular:     Rate and Rhythm: Normal rate.     Pulses: Normal pulses.  Pulmonary:     Effort: Pulmonary effort is normal.  Skin:    General: Skin is warm and dry.     Findings: Bruising present.  Neurological:     Mental Status: She is alert and oriented to person, place, and time.  Psychiatric:        Mood and Affect: Mood normal.        Behavior: Behavior normal.        Thought Content: Thought content normal.        Judgment: Judgment normal.     BP 101/68   Pulse 76   Ht 5\' 5"  (1.651 m)   Wt 199 lb (90.3 kg)   BMI 33.12 kg/m   Past Medical  History:  Diagnosis Date  . Arthritis    knee  . GERD (gastroesophageal reflux disease)     Social History   Socioeconomic History  . Marital status: Married    Spouse name: Not on file  . Number of children: Not on file  . Years of education: Not on file  . Highest education level: Not on file  Occupational History  . Not on file  Tobacco Use  . Smoking status: Former Smoker    Packs/day: 0.25    Years: 43.00    Pack years: 10.75    Types: Cigarettes  . Smokeless tobacco: Never Used  . Tobacco comment: since age 89  Vaping Use  . Vaping Use: Never used  Substance and Sexual Activity  . Alcohol use: Yes    Comment: Holidays  . Drug use: No  . Sexual activity: Not on file  Other Topics Concern  . Not on file  Social History Narrative  . Not on file   Social Determinants of Health   Financial Resource Strain: Not on file  Food Insecurity: Not on file  Transportation Needs: Not on file  Physical Activity: Not on file  Stress: Not on file  Social Connections: Not on file  Intimate Partner Violence: Not on file    Past Surgical History:  Procedure Laterality Date  . ABDOMINAL HYSTERECTOMY    . COLONOSCOPY WITH PROPOFOL N/A 05/13/2015   Procedure: COLONOSCOPY WITH PROPOFOL;  Surgeon: Manya Silvas, MD;  Location: [facility] ENDOSCOPY;  Service: Endoscopy;  Laterality: N/A;  . ESOPHAGOGASTRODUODENOSCOPY (EGD) WITH PROPOFOL N/A 05/13/2015   Procedure: ESOPHAGOGASTRODUODENOSCOPY (EGD) WITH PROPOFOL;  Surgeon: Manya Silvas, MD;  Location: [facility] ENDOSCOPY;  Service: Endoscopy;  Laterality: N/A;  . KNEE ARTHROSCOPY    . KNEE ARTHROSCOPY Right 06/26/2017   Procedure: ARTHROSCOPY KNEE WITH DEBRIDEMENT AND REPAIR VS PARTIAL MEDIAL MENISCECTOMY;  Surgeon: Corky Mull, MD;  Location: [place];  Service: Orthopedics;  Laterality: Right;    History reviewed. No pertinent family history.  No Known Allergies  CBC Latest Ref Rng & Units 01/03/2019 08/26/2018 12/06/2017   WBC 4.0 - 10.5 K/uL 7.0 9.3 8.1  Hemoglobin 12.0 - 15.0 g/dL 12.3 12.4 13.0  Hematocrit 36.0 - 46.0 % 37.9 37.6 37.6  Platelets 150 - 400 K/uL 361 301 295      CMP     Component Value Date/Time   NA 137 01/03/2019 1448   NA 144 03/28/2011 2049   K 4.2 01/03/2019 1448   K 4.2 03/28/2011 2049   CL 105 01/03/2019 1448   CL 105 03/28/2011 2049   CO2 25 01/03/2019 1448   CO2 28 03/28/2011 2049   GLUCOSE 101 (H) 01/03/2019 1448   GLUCOSE 100 (H) 03/28/2011 2049   BUN 15 01/03/2019 1448   BUN 11 03/28/2011 2049   CREATININE 0.50 12/11/2019 0816   CREATININE 0.48 (L) 03/28/2011 2049   CALCIUM 8.8 (L) 01/03/2019 1448   CALCIUM 8.9 03/28/2011 2049   PROT 7.2 01/03/2019 1448   ALBUMIN 3.9 01/03/2019 1448   AST 25 01/03/2019 1448   ALT 30 01/03/2019 1448   ALKPHOS 59 01/03/2019 1448   BILITOT 0.7 01/03/2019 1448   GFRNONAA >60 01/03/2019 1448   GFRNONAA >60 03/28/2011 2049   GFRAA >60 01/03/2019 1448   GFRAA >60 03/28/2011 2049     No results found.     Assessment & Plan:   1. Varicose veins with pain Recommend:  The patient  has persistent symptoms of pain and intense itching that are having a negative impact on daily life and daily activities, worsened by her varicosities on her legs.  Patient should undergo injection sclerotherapy to treat the residual varicosities.  The risks, benefits and alternative therapies were reviewed in detail with the patient.  All questions were answered.  The patient agrees to proceed with sclerotherapy at their convenience.  The patient will continue wearing the graduated compression stockings and using the over-the-counter pain medications to treat her symptoms.       2. Pruritus I suspect that her itching is not related totally to her varicosities.  This is due to the fact that the intense itching is widespread all over her body.  Based on some of the photos that the patient showed there was a concern for possible dermatological  causes such as urticaria.  Patient is advised to take Claritin daily and hydroxyzine at night if needed for intense itching.  We will also send the patient to dermatology for further evaluation. - loratadine (CLARITIN) 10 MG tablet; Take 1 tablet (10 mg total) by mouth daily.  Dispense: 30 tablet; Refill: 6 - hydrOXYzine (ATARAX/VISTARIL) 10 MG tablet; Take 1 tablet (10 mg total) by mouth at bedtime as needed and  may repeat dose one time if needed.  Dispense: 30 tablet; Refill: 3 - Ambulatory referral to Dermatology  3. Current every day smoker Smoking cessation was discussed, 3-10 minutes spent on this topic specifically      Current Outpatient Medications on File Prior to Visit  Medication Sig Dispense Refill  . artificial tears (LACRILUBE) OINT ophthalmic ointment Use in left eye and tape shut at night (Patient not taking: Reported on 04/13/2020) 1 g 1  . hydroxypropyl methylcellulose / hypromellose (ISOPTO TEARS / GONIOVISC) 2.5 % ophthalmic solution Place 1 drop into the left eye as needed for dry eyes. (Patient not taking: Reported on 04/13/2020) 15 mL 12  . predniSONE (DELTASONE) 20 MG tablet Take 3 tablets a day (Patient not taking: Reported on 04/13/2020) 18 tablet 0  . valACYclovir (VALTREX) 1000 MG tablet Take one pill tid (Patient not taking: Reported on 04/13/2020) 20 tablet 0   No current facility-administered medications on file prior to visit.    There are no Patient Instructions on file for this visit. No follow-ups on file.   Kris Hartmann, NP

## 2020-06-28 ENCOUNTER — Ambulatory Visit: Payer: Commercial Managed Care - PPO | Admitting: Dermatology

## 2020-09-29 ENCOUNTER — Ambulatory Visit: Payer: Commercial Managed Care - PPO | Admitting: Dermatology

## 2021-01-18 ENCOUNTER — Other Ambulatory Visit: Payer: Self-pay | Admitting: Internal Medicine

## 2021-01-18 DIAGNOSIS — Z1231 Encounter for screening mammogram for malignant neoplasm of breast: Secondary | ICD-10-CM

## 2021-07-05 ENCOUNTER — Encounter: Payer: Self-pay | Admitting: Emergency Medicine

## 2021-07-05 ENCOUNTER — Emergency Department: Payer: Commercial Managed Care - PPO

## 2021-07-05 ENCOUNTER — Other Ambulatory Visit: Payer: Self-pay

## 2021-07-05 ENCOUNTER — Emergency Department
Admission: EM | Admit: 2021-07-05 | Discharge: 2021-07-06 | Disposition: A | Discharge status: Home / Self Care | Payer: Commercial Managed Care - PPO | Attending: Emergency Medicine | Admitting: Emergency Medicine

## 2021-07-05 DIAGNOSIS — R079 Chest pain, unspecified: Secondary | ICD-10-CM | POA: Insufficient documentation

## 2021-07-05 LAB — CBC
HCT: 39.2 % (ref 36.0–46.0)
Hemoglobin: 12.7 g/dL (ref 12.0–15.0)
MCH: 29.8 pg (ref 26.0–34.0)
MCHC: 32.4 g/dL (ref 30.0–36.0)
MCV: 92 fL (ref 80.0–100.0)
Platelets: 277 10*3/uL (ref 150–400)
RBC: 4.26 MIL/uL (ref 3.87–5.11)
RDW: 12.9 % (ref 11.5–15.5)
WBC: 7.4 10*3/uL (ref 4.0–10.5)
nRBC: 0 % (ref 0.0–0.2)

## 2021-07-05 LAB — BASIC METABOLIC PANEL
Anion gap: 8 (ref 5–15)
BUN: 13 mg/dL (ref 8–23)
CO2: 26 mmol/L (ref 22–32)
Calcium: 9.1 mg/dL (ref 8.9–10.3)
Chloride: 105 mmol/L (ref 98–111)
Creatinine, Ser: 0.8 mg/dL (ref 0.44–1.00)
GFR, Estimated: >60 mL/min (ref >60)
Glucose, Bld: 129 mg/dL — ABNORMAL HIGH (ref 70–99)
Potassium: 3.6 mmol/L (ref 3.5–5.1)
Sodium: 139 mmol/L (ref 135–145)

## 2021-07-05 LAB — TROPONIN I (HIGH SENSITIVITY): Troponin I (High Sensitivity): 2 ng/L (ref <18)

## 2021-07-05 NOTE — ED Provider Notes (Signed)
? ?[facility] ?Provider Note ? ? ? Event Date/Time  ? First MD Initiated Contact with Patient 07/05/21 2332   ?  (approximate) ? ? ?History  ? ?Chest Pain ? ? ?HPI ? ?Laurie Gonzalez is a 66 y.o. female  GERD, arthritis, obesity   who presents with chest pain.  Symptoms and going on for 2 weeks.  She endorses pressure-like sensation under the left breast radiating around to the left axilla.  It is constant is worse with movement.  Does feel somewhat worse with deep inspiration as well but denies shortness of breath.  No fevers coughing hemoptysis.  Symptoms are not exertional she denies nausea or diaphoresis associated with it.  No history of similar.  Patient did try to open a door that was stuck prior to the onset of pain this is the only thing she can think of that may have provoked it. The patient denies hx of prior DVT/PE, unilateral leg pain/swelling, hormone use, recent surgery, hx of cancer, prolonged immobilization, or hemoptysis.  ? ? ?  ? ?Past Medical History:  ?Diagnosis Date  ? Arthritis   ? knee  ? GERD (gastroesophageal reflux disease)   ? ? ?Patient Active Problem List  ? Diagnosis Date Noted  ? Facial paralysis/Bells palsy 01/13/2019  ? Atrial mass 05/19/2018  ? Lumbar radiculopathy 08/06/2017  ? Chronic left-sided low back pain with left-sided sciatica 08/06/2017  ? Complex tear of lateral meniscus of right knee as current injury 06/27/2017  ? Complex tear of medial meniscus of right knee as current injury 09/20/2016  ? Primary osteoarthritis of right knee 09/20/2016  ? Current every day smoker 01/21/2015  ? Obesity, Class II, BMI 35-39.9 01/21/2015  ? ? ? ?Physical Exam  ?Triage Vital Signs: ?ED Triage Vitals  ?Enc Vitals Group  ?   BP 07/05/21 2224 128/89  ?   Pulse Rate 07/05/21 2224 69  ?   Resp 07/05/21 2224 18  ?   Temp 07/05/21 2224 98 ?F (36.7 ?C)  ?   Temp Source 07/05/21 2224 Oral  ?   SpO2 07/05/21 2224 95 %  ?   Weight 07/05/21 2222 195 lb (88.5 kg)  ?   Height  07/05/21 2222 '5\' 5"'$  (1.651 m)  ?   Head Circumference --   ?   Peak Flow --   ?   Pain Score 07/05/21 2220 7  ?   Pain Loc --   ?   Pain Edu? --   ?   Excl. in [place]? --   ? ? ?Most recent vital signs: ?Vitals:  ? 07/06/21 0045 07/06/21 0100  ?BP:    ?Pulse: 66 73  ?Resp: 19 11  ?Temp:    ?SpO2: 98% 99%  ? ? ? ?General: Awake, no distress.  ?CV:  Good peripheral perfusion.  No lower extremity edema ?Resp:  Normal effort.  Lungs are clear ?Abd:  No distention.  ?Neuro:             Awake, Alert, Oriented x 3  ?Other:  Tenderness to palpation under the left breast, no skin changes ? ? ?ED Results / Procedures / Treatments  ?Labs ?(all labs ordered are listed, but only abnormal results are displayed) ?Labs Reviewed  ?BASIC METABOLIC PANEL - Abnormal; Notable for the following components:  ?    Result Value  ? Glucose, Bld 129 (*)   ? All other components within normal limits  ?CBC  ?D-DIMER, QUANTITATIVE  ?TROPONIN I (HIGH SENSITIVITY)  ?  TROPONIN I (HIGH SENSITIVITY)  ? ? ? ?EKG ? ?EKG interpreted myself, right bundle branch block, normal axis no acute ischemic changes ? ? ?RADIOLOGY ?I reviewed the CXR which does not show any acute cardiopulmonary process; agree with radiology report  ? ? ? ?PROCEDURES: ? ?Critical Care performed: No ? ?.1-3 Lead EKG Interpretation ?Performed by: Rada Hay, MD ?Authorized by: Rada Hay, MD  ? ?  Interpretation: normal   ?  ECG rate assessment: normal   ?  Ectopy: none   ?  Conduction: normal   ? ?The patient is on the cardiac monitor to evaluate for evidence of arrhythmia and/or significant heart rate changes. ? ? ?MEDICATIONS ORDERED IN ED: ?Medications  ?oxyCODONE-acetaminophen (PERCOCET/ROXICET) 5-325 MG per tablet 1 tablet (1 tablet Oral Given 07/06/21 0045)  ? ? ? ?IMPRESSION / MDM / ASSESSMENT AND PLAN / ED COURSE  ?I reviewed the triage vital signs and the nursing notes. ?             ?               ?Differential diagnosis includes, but is not limited to,  musculoskeletal, PE, pleurisy, less likely ACS ? ?The patient is a 66 year old female with no significant cardiac risk factors who presents with 2 weeks of chest pain.  As pressure-like under the left breast worse with movement and somewhat pleuritic.  Is not exertional and not associate with shortness of breath diaphoresis or nausea.  Patient's vital signs are within normal limits.  She does appear somewhat uncomfortable with movement and is tender under the left breast.  Reviewed her EKG which shows a right bundle, prior EKG did not have this.  Chest x-ray does not have any acute change.  Suspect musculoskeletal but PE certainly is in the differential especially with a new right bundle so D-dimer was obtained and this is negative.  Patient has no risk factors low risk so feel that this essentially rules out PE.  Serial troponins are negative.  Ultimately suspect pleurisy versus musculoskeletal.  Recommended continuing ibuprofen and following up with PCP.  She is appropriate for discharge. ? ?Clinical Course as of 07/06/21 0149  ?Thu Jul 06, 2021  ?0115 D-Dimer, Quant: <0.27 [KM]  ?  ?Clinical Course User Index ?[KM] Rada Hay, MD  ? ? ? ?FINAL CLINICAL IMPRESSION(S) / ED DIAGNOSES  ? ?Final diagnoses:  ?Chest pain, unspecified type  ? ? ? ?Rx / DC Orders  ? ?ED Discharge Orders   ? ? None  ? ?  ? ? ? ?Note:  This document was prepared using Dragon voice recognition software and may include unintentional dictation errors. ?  ?Rada Hay, MD ?07/06/21 0149 ? ?

## 2021-07-05 NOTE — ED Triage Notes (Signed)
Patient ambulatory to triage with steady gait, without difficulty or distress noted; pt reports sharp pain under left breast for 2wks, nonradiating with no accomp symptoms ?

## 2021-07-06 LAB — D-DIMER, QUANTITATIVE: D-Dimer, Quant: <0.27 ug/mL-FEU (ref 0.00–0.50)

## 2021-07-06 LAB — TROPONIN I (HIGH SENSITIVITY): Troponin I (High Sensitivity): 4 ng/L (ref <18)

## 2021-07-06 MED ORDER — OXYCODONE-ACETAMINOPHEN 5-325 MG PO TABS
1.0000 | ORAL_TABLET | Freq: Once | ORAL | Status: AC
  Administered 2021-07-06: 1 via ORAL
  Filled 2021-07-06: qty 1

## 2021-07-06 NOTE — Discharge Instructions (Signed)
Your blood work, EKG and chest x-ray were all reassuring today.  It is possible that this is related to musculoskeletal issue.  You can take ibuprofen 400 mg every 6 hours.  If you are developing new symptoms or your symptoms are worsening you can return to the emergency department.  Please follow-up with your primary care provider. ?

## 2021-07-24 ENCOUNTER — Other Ambulatory Visit: Payer: Self-pay | Admitting: Internal Medicine

## 2021-07-24 DIAGNOSIS — Z1231 Encounter for screening mammogram for malignant neoplasm of breast: Secondary | ICD-10-CM

## 2021-07-24 DIAGNOSIS — R0789 Other chest pain: Secondary | ICD-10-CM

## 2021-07-24 DIAGNOSIS — I5189 Other ill-defined heart diseases: Secondary | ICD-10-CM

## 2021-08-03 ENCOUNTER — Ambulatory Visit
Admission: RE | Admit: 2021-08-03 | Discharge: 2021-08-03 | Disposition: A | Discharge status: Home / Self Care | Payer: Commercial Managed Care - PPO | Source: Ambulatory Visit | Attending: Internal Medicine | Admitting: Internal Medicine

## 2021-08-03 DIAGNOSIS — R0789 Other chest pain: Secondary | ICD-10-CM | POA: Insufficient documentation

## 2021-08-03 DIAGNOSIS — I5189 Other ill-defined heart diseases: Secondary | ICD-10-CM | POA: Diagnosis present

## 2021-08-03 MED ORDER — IOHEXOL 300 MG/ML  SOLN
75.0000 mL | Freq: Once | INTRAMUSCULAR | Status: AC | PRN
  Administered 2021-08-03: 75 mL via INTRAVENOUS

## 2021-11-02 ENCOUNTER — Ambulatory Visit
Admission: EM | Admit: 2021-11-02 | Discharge: 2021-11-02 | Disposition: A | Discharge status: Home / Self Care | Payer: Commercial Managed Care - PPO | Attending: Emergency Medicine | Admitting: Emergency Medicine

## 2021-11-02 DIAGNOSIS — J069 Acute upper respiratory infection, unspecified: Secondary | ICD-10-CM

## 2021-11-02 MED ORDER — AMOXICILLIN-POT CLAVULANATE 875-125 MG PO TABS: 1.0000 | ORAL_TABLET | Freq: Two times a day (BID) | ORAL | 0 refills | Status: AC

## 2021-11-02 MED ORDER — BENZONATATE 100 MG PO CAPS: 100.0000 mg | ORAL_CAPSULE | Freq: Three times a day (TID) | ORAL | 0 refills | Status: DC

## 2021-11-02 MED ORDER — PROMETHAZINE-DM 6.25-15 MG/5ML PO SYRP: 5.0000 mL | ORAL_SOLUTION | Freq: Four times a day (QID) | ORAL | 0 refills | Status: DC | PRN

## 2021-11-02 NOTE — ED Provider Notes (Signed)
[name]    CSN: [id number] Arrival date & time: 11/02/21  1836      History   Chief Complaint Chief Complaint  Patient presents with   Cough    HPI Laurie Gonzalez is a 66 y.o. female.   Patient presents with chills, or throat nasal congestion, rhinorrhea and intermittent wheezing and a persistent nonproductive cough for 2 weeks.  Feels as if there is mucus sitting directly in the chest that she has had difficulty moving.  Symptoms are worsened at nighttime causing interference with sleeping.  No known sick contacts.  Decreased appetite but tolerating fluids.  Has attempted use of DayQuil, NyQuil and Mucinex which have been minimally helpful.  Denies respiratory history.  Denies shortness of breath, chest pain or tightness.   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   Atrial mass 05/19/2018   Lumbar radiculopathy 08/06/2017   Chronic left-sided low back pain with left-sided sciatica 08/06/2017   Complex tear of lateral meniscus of right knee as current injury 06/27/2017   Complex tear of medial meniscus of right knee as current injury 09/20/2016   Primary osteoarthritis of right knee 09/20/2016   Current every day smoker 01/21/2015   Obesity, Class II, BMI 35-39.9 01/21/2015    Past Surgical History:  Procedure Laterality Date   ABDOMINAL HYSTERECTOMY     COLONOSCOPY WITH PROPOFOL N/A 05/13/2015   Procedure: COLONOSCOPY WITH PROPOFOL;  Surgeon: Manya Silvas, MD;  Location: [facility];  Service: Endoscopy;  Laterality: N/A;   ESOPHAGOGASTRODUODENOSCOPY (EGD) WITH PROPOFOL N/A 05/13/2015   Procedure: ESOPHAGOGASTRODUODENOSCOPY (EGD) WITH PROPOFOL;  Surgeon: Manya Silvas, MD;  Location: [facility] ENDOSCOPY;  Service:  Endoscopy;  Laterality: N/A;   KNEE ARTHROSCOPY     KNEE ARTHROSCOPY Right 06/26/2017   Procedure: ARTHROSCOPY KNEE WITH DEBRIDEMENT AND REPAIR VS PARTIAL MEDIAL MENISCECTOMY;  Surgeon: Corky Mull, MD;  Location: [facility];  Service: Orthopedics;  Laterality: Right;    OB History   No obstetric history on file.      Home Medications    Prior to Admission medications   Medication Sig Start Date End Date Taking? Authorizing Provider  amoxicillin-clavulanate (AUGMENTIN) 875-125 MG tablet Take 1 tablet by mouth every 12 (twelve) hours for 10 days. 11/02/21 11/12/21 Yes Leone Mobley R, NP  benzonatate (TESSALON) 100 MG capsule Take 1 capsule (100 mg total) by mouth every 8 (eight) hours. 11/02/21  Yes Tilford Deaton R, NP  promethazine-dextromethorphan (PROMETHAZINE-DM) 6.25-15 MG/5ML syrup Take 5 mLs by mouth 4 (four) times daily as needed for cough. 11/02/21  Yes Hans Eden, NP  artificial tears (LACRILUBE) OINT ophthalmic ointment Use in left eye and tape shut at night Patient not taking: Reported on 04/13/2020 01/03/19   Milton Ferguson, MD  hydroxypropyl methylcellulose / hypromellose (ISOPTO TEARS / GONIOVISC) 2.5 % ophthalmic solution Place 1 drop into the left eye as needed for dry eyes. Patient not taking: Reported on 04/13/2020 01/03/19   Milton Ferguson, MD  hydrOXYzine (ATARAX/VISTARIL) 10 MG tablet Take 1 tablet (10 mg total) by mouth at bedtime as needed and may repeat dose one time if needed. 04/13/20   Kris Hartmann, NP  loratadine (CLARITIN) 10 MG tablet Take 1 tablet (10 mg total) by  mouth daily. 04/13/20   Kris Hartmann, NP  predniSONE (DELTASONE) 20 MG tablet Take 3 tablets a day Patient not taking: Reported on 04/13/2020 01/03/19   Milton Ferguson, MD  valACYclovir (VALTREX) 1000 MG tablet Take one pill tid Patient not taking: Reported on 04/13/2020 01/03/19   Milton Ferguson, MD    Family History No family history on file.  Social History Social History    Tobacco Use   Smoking status: Former    Packs/day: 0.25    Years: 43.00    Total pack years: 10.75    Types: Cigarettes   Smokeless tobacco: Never   Tobacco comments:    since age 31  Vaping Use   Vaping Use: Never used  Substance Use Topics   Alcohol use: Yes    Comment: Holidays   Drug use: No     Allergies   Patient has no known allergies.   Review of Systems Review of Systems Defer  to HPI   Physical Exam Triage Vital Signs ED Triage Vitals  Enc Vitals Group     BP      Pulse      Resp      Temp      Temp src      SpO2      Weight      Height      Head Circumference      Peak Flow      Pain Score      Pain Loc      Pain Edu?      Excl. in [place]?    No data found.  Updated Vital Signs There were no vitals taken for this visit.  Visual Acuity Right Eye Distance:   Left Eye Distance:   Bilateral Distance:    Right Eye Near:   Left Eye Near:    Bilateral Near:     Physical Exam Constitutional:      Appearance: Normal appearance.  HENT:     Head: Normocephalic.     Right Ear: Tympanic membrane, ear canal and external ear normal.     Left Ear: Tympanic membrane, ear canal and external ear normal.     Nose: Congestion and rhinorrhea present.     Mouth/Throat:     Mouth: Mucous membranes are moist.     Pharynx: Oropharynx is clear.  Eyes:     Extraocular Movements: Extraocular movements intact.  Cardiovascular:     Rate and Rhythm: Normal rate and regular rhythm.     Pulses: Normal pulses.     Heart sounds: Normal heart sounds.  Pulmonary:     Effort: Pulmonary effort is normal.     Breath sounds: Normal breath sounds.     Comments: Dry persistent cough witnessed within exam room Skin:    General: Skin is warm and dry.  Neurological:     Mental Status: She is alert and oriented to person, place, and time. Mental status is at baseline.  Psychiatric:        Mood and Affect: Mood normal.        Behavior: Behavior normal.      UC  Treatments / Results  Labs (all labs ordered are listed, but only abnormal results are displayed) Labs Reviewed - No data to display  EKG   Radiology No results found.  Procedures Procedures (including critical care time)  Medications Ordered in UC Medications - No data to display  Initial Impression / Assessment and Plan / UC Course  I have reviewed the triage vital signs and the nursing notes.  Pertinent labs & imaging results that were available during my care of the patient were reviewed by me and considered in my medical decision making (see chart for details).  Acute upper respiratory infection  Vital signs are stable, reviewed on machine, O2 saturation is above 90, lungs are clear to auscultation but cough witnessed within exam room, low suspicion for pneumonia, pneumothorax or bronchitis and therefore will defer imaging, symptoms have been present for 2 weeks without resolution and will provide bacterial coverage, Augmentin 10-day course prescribed as well as Tessalon and Promethazine DM for additional management of coughing, may continue use of additional over-the-counter medications as needed for supportive care, recommended follow-up if symptoms persist, worsen Final Clinical Impressions(s) / UC Diagnoses   Final diagnoses:  Acute upper respiratory infection     Discharge Instructions      En el examen, sus pulmones estn limpios y est recibiendo suficiente aire sin ayuda o tengo una baja sospecha de que tenga neumona en este momento; sin embargo, como ha estado enfermo durante 2 semanas sin Teacher, English as a foreign language, se Designer, multimedia un antibitico.  Tome Augmentin todas las maanas y [place], lo ideal es que vea mejoras da a Training and development officer.  Puede usar la Film/video editor cada 8 horas para ayudar a Tourist information centre manager tos.  Puede usar jarabe para la tos cada 6 horas para mayor comodidad. Tenga en cuenta que el jarabe para la tos le provocar somnolencia. Si esto ocurre,  puede usarlo antes de acostarse y usar Delsym solo [place].  Puede continuar usando cualquiera de los medicamentos de venta libre que haya estado usando y que considere tiles; tambin puede probar cualquiera de los siguientes  Puede tomar Tylenol y/o ibuprofeno segn sea necesario para reducir la fiebre y Best boy.  Para la tos: 1/2 a 1 cucharadita de miel (puedes diluir la miel en agua u otro lquido). Tambin puedes usar guaifenesina y dextrometorfano para la tos. Puede utilizar un humidificador para la congestin del pecho y la tos. Si no tienes un humidificador, puedes sentarte en el bao con la ducha de agua caliente abierta.  Para el dolor de garganta: pruebe con grgaras de agua tibia con sal, pastillas de cepacol, aerosol para la garganta, t o agua tibia con limn/miel, paletas heladas o hielo, o medicamentos de venta libre para aliviar el resfriado para Health and safety inspector de Investment banker, operational.  Para la congestin: tome un antihistamnico diario como Zyrtec, Claritin y un descongestionante oral, como pseudoefedrina. Tambin puede utilizar Flonase 1-2 pulverizaciones en cada fosa nasal al da.  Es importante mantenerse hidratado: beba muchos lquidos (agua, gatorade/powerade/pedialyte, jugos o ts) para Consulting civil engineer garganta hidratada y ayudar a Public house manager an ms la irritacin o Health and safety inspector.     On exam your lungs are clear and you are getting enough air without assistance or I have a low suspicion that you have pneumonia at this time however as you been sick for 2 weeks without improvement you will be provided an antibiotic  Take Augmentin every morning and every evening for 10 days, ideally you will see improvement day by day  You may use Tessalon pill every 8 hours to help calm your coughing  You may use cough syrup every 6 hours for additional comfort, be mindful the cough syrup will make you drowsy, if this occurs you may use it at bedtime and use plain Delsym throughout the  day  May  continue use of any of the over-the-counter's medicine that you have been using that you have deemed helpful, may also try any of the additional below    You can take Tylenol and/or Ibuprofen as needed for fever reduction and pain relief.   For cough: honey 1/2 to 1 teaspoon (you can dilute the honey in water or another fluid).  You can also use guaifenesin and dextromethorphan for cough. You can use a humidifier for chest congestion and cough.  If you don't have a humidifier, you can sit in the bathroom with the hot shower running.      For sore throat: try warm salt water gargles, cepacol lozenges, throat spray, warm tea or water with lemon/honey, popsicles or ice, or OTC cold relief medicine for throat discomfort.   For congestion: take a daily anti-histamine like Zyrtec, Claritin, and a oral decongestant, such as pseudoephedrine.  You can also use Flonase 1-2 sprays in each nostril daily.   It is important to stay hydrated: drink plenty of fluids (water, gatorade/powerade/pedialyte, juices, or teas) to keep your throat moisturized and help further relieve irritation/discomfort.    ED Prescriptions     Medication Sig Dispense Auth. Provider   amoxicillin-clavulanate (AUGMENTIN) 875-125 MG tablet Take 1 tablet by mouth every 12 (twelve) hours for 10 days. 20 tablet Garnet Chatmon R, NP   benzonatate (TESSALON) 100 MG capsule Take 1 capsule (100 mg total) by mouth every 8 (eight) hours. 42 capsule Niambi Smoak R, NP   promethazine-dextromethorphan (PROMETHAZINE-DM) 6.25-15 MG/5ML syrup Take 5 mLs by mouth 4 (four) times daily as needed for cough. 118 mL Maranda Marte, Leitha Schuller, NP      PDMP not reviewed this encounter.   Hans Eden, [place] 11/02/21 1934

## 2021-11-02 NOTE — Discharge Instructions (Addendum)
En el examen, sus pulmones estn limpios y est recibiendo suficiente aire sin ayuda o tengo una baja sospecha de que tenga neumona en este momento; sin embargo, como ha estado enfermo durante 2 semanas sin Teacher, English as a foreign language, se Designer, multimedia un antibitico.  Tome Augmentin todas las maanas y [place], lo ideal es que vea Manufacturing engineer a Training and development officer.  Puede usar la Film/video editor cada 8 horas para ayudar a Tourist information centre manager tos.  Puede usar jarabe para la tos cada 6 horas para mayor comodidad. Tenga en cuenta que el jarabe para la tos le provocar somnolencia. Si esto ocurre, puede usarlo antes de acostarse y usar Delsym solo [place].  Puede continuar usando cualquiera de los medicamentos de venta libre que haya estado usando y que considere tiles; tambin puede probar cualquiera de los siguientes  Puede tomar Tylenol y/o ibuprofeno segn sea necesario para reducir la fiebre y Best boy.  Para la tos: 1/2 a 1 cucharadita de miel (puedes diluir la miel en agua u otro lquido). Tambin puedes usar guaifenesina y dextrometorfano para la tos. Puede utilizar un humidificador para la congestin del pecho y la tos. Si no tienes un humidificador, puedes sentarte en el bao con la ducha de agua caliente abierta.  Para el dolor de garganta: pruebe con grgaras de agua tibia con sal, pastillas de cepacol, aerosol para la garganta, t o agua tibia con limn/miel, paletas heladas o hielo, o medicamentos de venta libre para aliviar el resfriado para Health and safety inspector de Investment banker, operational.  Para la congestin: tome un antihistamnico diario como Zyrtec, Claritin y un descongestionante oral, como pseudoefedrina. Tambin puede utilizar Flonase 1-2 pulverizaciones en cada fosa nasal al da.  Es importante mantenerse hidratado: beba muchos lquidos (agua, gatorade/powerade/pedialyte, jugos o ts) para Consulting civil engineer garganta hidratada y ayudar a Public house manager an ms la irritacin o Health and safety inspector.     On exam your  lungs are clear and you are getting enough air without assistance or I have a low suspicion that you have pneumonia at this time however as you been sick for 2 weeks without improvement you will be provided an antibiotic  Take Augmentin every morning and every evening for 10 days, ideally you will see improvement day by day  You may use Tessalon pill every 8 hours to help calm your coughing  You may use cough syrup every 6 hours for additional comfort, be mindful the cough syrup will make you drowsy, if this occurs you may use it at bedtime and use plain Delsym throughout the day  May continue use of any of the over-the-counter's medicine that you have been using that you have deemed helpful, may also try any of the additional below    You can take Tylenol and/or Ibuprofen as needed for fever reduction and pain relief.   For cough: honey 1/2 to 1 teaspoon (you can dilute the honey in water or another fluid).  You can also use guaifenesin and dextromethorphan for cough. You can use a humidifier for chest congestion and cough.  If you don't have a humidifier, you can sit in the bathroom with the hot shower running.      For sore throat: try warm salt water gargles, cepacol lozenges, throat spray, warm tea or water with lemon/honey, popsicles or ice, or OTC cold relief medicine for throat discomfort.   For congestion: take a daily anti-histamine like Zyrtec, Claritin, and a oral decongestant, such as pseudoephedrine.  You can also use  Flonase 1-2 sprays in each nostril daily.   It is important to stay hydrated: drink plenty of fluids (water, gatorade/powerade/pedialyte, juices, or teas) to keep your throat moisturized and help further relieve irritation/discomfort.

## 2021-11-02 NOTE — ED Triage Notes (Signed)
Pt presents with c/o cough for past 2 weeks, c/o pain with cough, negative covid test

## 2021-11-08 ENCOUNTER — Ambulatory Visit
Admission: RE | Admit: 2021-11-08 | Discharge: 2021-11-08 | Disposition: A | Discharge status: Home / Self Care | Payer: Commercial Managed Care - PPO | Source: Ambulatory Visit | Attending: Internal Medicine | Admitting: Internal Medicine

## 2021-11-08 DIAGNOSIS — Z1231 Encounter for screening mammogram for malignant neoplasm of breast: Secondary | ICD-10-CM | POA: Insufficient documentation

## 2022-01-25 ENCOUNTER — Ambulatory Visit
Admission: EM | Admit: 2022-01-25 | Discharge: 2022-01-25 | Disposition: A | Discharge status: Home / Self Care | Payer: Commercial Managed Care - PPO | Attending: Urgent Care | Admitting: Urgent Care

## 2022-01-25 DIAGNOSIS — J209 Acute bronchitis, unspecified: Secondary | ICD-10-CM | POA: Diagnosis present

## 2022-01-25 DIAGNOSIS — R6889 Other general symptoms and signs: Secondary | ICD-10-CM | POA: Diagnosis present

## 2022-01-25 DIAGNOSIS — H6692 Otitis media, unspecified, left ear: Secondary | ICD-10-CM | POA: Diagnosis present

## 2022-01-25 DIAGNOSIS — Z1152 Encounter for screening for COVID-19: Secondary | ICD-10-CM | POA: Insufficient documentation

## 2022-01-25 LAB — RESP PANEL BY RT-PCR (RSV, FLU A&B, COVID)  RVPGX2
Influenza A by PCR: NEGATIVE
Influenza B by PCR: NEGATIVE
Resp Syncytial Virus by PCR: POSITIVE — AB
SARS Coronavirus 2 by RT PCR: NEGATIVE

## 2022-01-25 MED ORDER — BENZONATATE 100 MG PO CAPS: ORAL_CAPSULE | ORAL | 0 refills | Status: DC

## 2022-01-25 MED ORDER — AMOXICILLIN-POT CLAVULANATE 875-125 MG PO TABS: 1.0000 | ORAL_TABLET | Freq: Two times a day (BID) | ORAL | 0 refills | Status: AC

## 2022-01-25 MED ORDER — ONDANSETRON HCL 4 MG PO TABS: 4.0000 mg | ORAL_TABLET | Freq: Four times a day (QID) | ORAL | 0 refills | Status: AC

## 2022-01-25 MED ORDER — PREDNISONE 20 MG PO TABS: ORAL_TABLET | ORAL | 0 refills | Status: AC

## 2022-01-25 MED ORDER — HYDROCOD POLI-CHLORPHE POLI ER 10-8 MG/5ML PO SUER: 5.0000 mL | Freq: Every evening | ORAL | 0 refills | Status: AC | PRN

## 2022-01-25 NOTE — Discharge Instructions (Addendum)
The results of your respiratory swab will be available in your MyChart account tomorrow.  I have prescribed the following medications:  - Benzonatate.  This is a cough medicine which you can take day or night.  You can take this along with all other medications without concern for interaction. - Chlorpheniramine-hydrocodone.  This is a cough syrup which will make you sleepy.  Only take this medicine at night.  If you have a bad reaction to this medicine then stop taking it. - Amoxicillin-clavulanate.  This is an antibiotic used to treat your ear infection.  You should take this medication twice a day. - Prednisone.  This is a steroid medicine used to help your sinus congestion and cough.  This medicine should be taken with food in the morning. - Ondansetron. This is a nausea medication. Put one tablet in your mouth and let it dissolve without water.  You may continue to take tylenol or ibuprofen for fever or body aches.  You may also take daytime over the counter cough and cold medication to control your symptoms.  Please be aware that some of these medicines already have Tylenol or ibuprofen in them.  Do not overdose Tylenol or ibuprofen.  Follow up here or with your primary care provider if your symptoms are worsening or not improving.

## 2022-01-25 NOTE — ED Triage Notes (Signed)
Pt. Presents to UC w/ c/o a sore throat, cough, bilateral ear pain, headache, and nasal congestion that started 4 days ago.

## 2022-01-25 NOTE — ED Provider Notes (Signed)
[facility]    CSN: [id number] Arrival date & time: 01/25/22  1726      History   Chief Complaint Chief Complaint  Patient presents with   Sore Throat   Cough   Chills   Generalized Body Aches   Otalgia   Headache    HPI Laurie Gonzalez is a 66 y.o. female.    Sore Throat Associated symptoms include headaches.  Cough Associated symptoms: ear pain and headaches   Otalgia Associated symptoms: cough and headaches   Headache Associated symptoms: cough and ear pain     Spanish speaking Presents to UC with complaint of sore throat, cough, bilateral ear pain, headache, nasal congestion starting 4 days ago.  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   Atrial mass 05/19/2018   Lumbar radiculopathy 08/06/2017   Chronic left-sided low back pain with left-sided sciatica 08/06/2017   Complex tear of lateral meniscus of right knee as current injury 06/27/2017   Complex tear of medial meniscus of right knee as current injury 09/20/2016   Primary osteoarthritis of right knee 09/20/2016   Current every day smoker 01/21/2015   Obesity, Class II, BMI 35-39.9 01/21/2015    Past Surgical History:  Procedure Laterality Date   ABDOMINAL HYSTERECTOMY     COLONOSCOPY WITH PROPOFOL N/A 05/13/2015   Procedure: COLONOSCOPY WITH PROPOFOL;  Surgeon: Manya Silvas, MD;  Location: [facility];  Service: Endoscopy;  Laterality: N/A;   ESOPHAGOGASTRODUODENOSCOPY (EGD) WITH PROPOFOL N/A 05/13/2015   Procedure: ESOPHAGOGASTRODUODENOSCOPY (EGD) WITH PROPOFOL;  Surgeon: Manya Silvas, MD;  Location: [facility] ENDOSCOPY;  Service: Endoscopy;  Laterality: N/A;   KNEE ARTHROSCOPY     KNEE ARTHROSCOPY Right 06/26/2017   Procedure: ARTHROSCOPY KNEE WITH DEBRIDEMENT AND REPAIR VS PARTIAL MEDIAL MENISCECTOMY;  Surgeon: Corky Mull, MD;   Location: [place];  Service: Orthopedics;  Laterality: Right;    OB History   No obstetric history on file.      Home Medications    Prior to Admission medications   Medication Sig Start Date End Date Taking? Authorizing Provider  meloxicam (MOBIC) 15 MG tablet Take by mouth. 11/20/21 11/20/22 Yes [provider]  traMADol (ULTRAM) 50 MG tablet Take 50 mg by mouth 2 (two) times daily as needed. 08/09/21  Yes [provider]  varenicline (CHANTIX) 1 MG tablet TOME UNA TABLETA (1 MG TOTAL) POR VIA ORAL DOS VECES AL DIA 12/13/21  Yes [provider]  Vitamin D, Ergocalciferol, (DRISDOL) 1.25 MG (50000 UNIT) CAPS capsule Take by mouth. 11/16/21  Yes [provider]  albuterol (VENTOLIN HFA) 108 (90 Base) MCG/ACT inhaler Inhale into the lungs.    [provider]  artificial tears (LACRILUBE) OINT ophthalmic ointment Use in left eye and tape shut at night Patient not taking: Reported on 04/13/2020 01/03/19   Milton Ferguson, MD  benzonatate (TESSALON) 100 MG capsule Take 1 capsule (100 mg total) by mouth every 8 (eight) hours. 11/02/21   White, Leitha Schuller, NP  hydroxypropyl methylcellulose / hypromellose (ISOPTO TEARS / GONIOVISC) 2.5 % ophthalmic solution Place 1 drop into the left eye as needed for dry eyes. Patient not taking: Reported on 04/13/2020 01/03/19   Milton Ferguson, MD  hydrOXYzine (ATARAX/VISTARIL) 10 MG tablet Take 1 tablet (10 mg total) by mouth at bedtime as needed and  may repeat dose one time if needed. 04/13/20   Kris Hartmann, NP  loratadine (CLARITIN) 10 MG tablet Take 1 tablet (10 mg total) by mouth daily. 04/13/20   Kris Hartmann, NP  predniSONE (DELTASONE) 20 MG tablet Take 3 tablets a day Patient not taking: Reported on 04/13/2020 01/03/19   Milton Ferguson, MD  promethazine-dextromethorphan (PROMETHAZINE-DM) 6.25-15 MG/5ML syrup Take 5 mLs by mouth 4 (four) times daily as needed for cough. 11/02/21   Hans Eden, NP   valACYclovir (VALTREX) 1000 MG tablet Take one pill tid Patient not taking: Reported on 04/13/2020 01/03/19   Milton Ferguson, MD    Family History History reviewed. No pertinent family history.  Social History Social History   Tobacco Use   Smoking status: Former    Packs/day: 0.25    Years: 43.00    Total pack years: 10.75    Types: Cigarettes   Smokeless tobacco: Never   Tobacco comments:    since age 86  Vaping Use   Vaping Use: Never used  Substance Use Topics   Alcohol use: Yes    Comment: Holidays   Drug use: No     Allergies   Patient has no known allergies.   Review of Systems Review of Systems  HENT:  Positive for ear pain.   Respiratory:  Positive for cough.   Neurological:  Positive for headaches.    Physical Exam Triage Vital Signs ED Triage Vitals  Enc Vitals Group     BP 01/25/22 1747 (!) 142/111     Pulse Rate 01/25/22 1747 88     Resp 01/25/22 1747 18     Temp 01/25/22 1747 98.1 F (36.7 C)     Temp src --      SpO2 01/25/22 1747 98 %     Weight --      Height --      Head Circumference --      Peak Flow --      Pain Score 01/25/22 1748 0     Pain Loc --      Pain Edu? --      Excl. in [place]? --    No data found.  Updated Vital Signs BP (!) 142/111   Pulse 88   Temp 98.1 F (36.7 C)   Resp 18   SpO2 98%   Visual Acuity Right Eye Distance:   Left Eye Distance:   Bilateral Distance:    Right Eye Near:   Left Eye Near:    Bilateral Near:     Physical Exam Constitutional:      Appearance: She is well-developed.  HENT:     Left Ear: Tympanic membrane is erythematous.     Nose: Congestion and rhinorrhea present.     Mouth/Throat:     Mouth: Mucous membranes are moist.     Pharynx: No oropharyngeal exudate or posterior oropharyngeal erythema.     Tonsils: No tonsillar exudate.  Eyes:     Conjunctiva/sclera: Conjunctivae normal.  Cardiovascular:     Rate and Rhythm: Normal rate and regular rhythm.     Heart sounds:  Normal heart sounds.  Pulmonary:     Effort: Pulmonary effort is normal.     Breath sounds: Normal breath sounds.  Abdominal:     General: Bowel sounds are normal.     Palpations: Abdomen is soft.  Neurological:     General: No focal deficit present.     Mental Status: She is alert and oriented  to person, place, and time.  Psychiatric:        Mood and Affect: Mood normal.        Behavior: Behavior normal.      UC Treatments / Results  Labs (all labs ordered are listed, but only abnormal results are displayed) Labs Reviewed  RESP PANEL BY RT-PCR (RSV, FLU A&B, COVID)  [id number]    EKG   Radiology No results found.  Procedures Procedures (including critical care time)  Medications Ordered in UC Medications - No data to display  Initial Impression / Assessment and Plan / UC Course  I have reviewed the triage vital signs and the nursing notes.  Pertinent labs & imaging results that were available during my care of the patient were reviewed by me and considered in my medical decision making (see chart for details).   Patient is afebrile here without recent antipyretics. Satting well on room air. Overall is ill appearing, though well hydrated, without respiratory distress. Pulmonary exam is remarkable only for presence of harsh cough triggered by deep breathes.    Left TM is erythematous and will treat for acute otitis media with Augmentin.  Also will prescribe benzonatate and Tussionex for daytime/nighttime cough respectively.  Giving Zofran for her symptoms of nausea.  Prednisone for her sinus congestion and bronchial cough.    Final Clinical Impressions(s) / UC Diagnoses   Final diagnoses:  Flu-like symptoms   Discharge Instructions   None    ED Prescriptions   None    PDMP not reviewed this encounter.   Rose Phi, [place] 01/25/22 1830

## 2022-03-26 ENCOUNTER — Ambulatory Visit
Admission: EM | Admit: 2022-03-26 | Discharge: 2022-03-26 | Disposition: A | Discharge status: Home / Self Care | Payer: Commercial Managed Care - PPO | Attending: Urgent Care | Admitting: Urgent Care

## 2022-03-26 ENCOUNTER — Encounter: Payer: Self-pay | Admitting: Emergency Medicine

## 2022-03-26 ENCOUNTER — Other Ambulatory Visit: Payer: Self-pay

## 2022-03-26 DIAGNOSIS — U071 COVID-19: Secondary | ICD-10-CM | POA: Diagnosis not present

## 2022-03-26 MED ORDER — PAXLOVID (300/100) 20 X 150 MG & 10 X 100MG PO TBPK: ORAL_TABLET | ORAL | 0 refills | Status: AC

## 2022-03-26 MED ORDER — AZELASTINE HCL 0.1 % NA SOLN: 1.0000 | Freq: Two times a day (BID) | NASAL | 12 refills | Status: AC

## 2022-03-26 MED ORDER — HYDROCOD POLI-CHLORPHE POLI ER 10-8 MG/5ML PO SUER: 5.0000 mL | Freq: Two times a day (BID) | ORAL | 0 refills | Status: AC | PRN

## 2022-03-26 MED ORDER — BENZONATATE 100 MG PO CAPS: ORAL_CAPSULE | ORAL | 0 refills | Status: DC

## 2022-03-26 NOTE — ED Provider Notes (Signed)
Laurie Gonzalez    CSN: [id number] Arrival date & time: 03/26/22  1428      History   Chief Complaint No chief complaint on file.   HPI Laurie Gonzalez is a 67 y.o. female.   HPI  Patient presents to urgent care with symptoms x 4 days.  She is COVID-positive at home.  Symptoms include cough, itchy throat, runny nose, body aches, headache.  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   Atrial mass 05/19/2018   Lumbar radiculopathy 08/06/2017   Chronic left-sided low back pain with left-sided sciatica 08/06/2017   Complex tear of lateral meniscus of right knee as current injury 06/27/2017   Complex tear of medial meniscus of right knee as current injury 09/20/2016   Primary osteoarthritis of right knee 09/20/2016   Current every day smoker 01/21/2015   Obesity, Class II, BMI 35-39.9 01/21/2015    Past Surgical History:  Procedure Laterality Date   ABDOMINAL HYSTERECTOMY     COLONOSCOPY WITH PROPOFOL N/A 05/13/2015   Procedure: COLONOSCOPY WITH PROPOFOL;  Surgeon: Manya Silvas, MD;  Location: [place];  Service: Endoscopy;  Laterality: N/A;   ESOPHAGOGASTRODUODENOSCOPY (EGD) WITH PROPOFOL N/A 05/13/2015   Procedure: ESOPHAGOGASTRODUODENOSCOPY (EGD) WITH PROPOFOL;  Surgeon: Manya Silvas, MD;  Location: [facility] ENDOSCOPY;  Service: Endoscopy;  Laterality: N/A;   KNEE ARTHROSCOPY     KNEE ARTHROSCOPY Right 06/26/2017   Procedure: ARTHROSCOPY KNEE WITH DEBRIDEMENT AND REPAIR VS PARTIAL MEDIAL MENISCECTOMY;  Surgeon: Corky Mull, MD;  Location: [place];  Service: Orthopedics;  Laterality: Right;    OB History   No obstetric history on file.      Home Medications    Prior to Admission medications   Medication Sig Start Date End Date Taking? Authorizing Provider  albuterol (VENTOLIN HFA) 108  (90 Base) MCG/ACT inhaler Inhale into the lungs.    [provider]  artificial tears (LACRILUBE) OINT ophthalmic ointment Use in left eye and tape shut at night Patient not taking: Reported on 04/13/2020 01/03/19   Milton Ferguson, MD  benzonatate (TESSALON) 100 MG capsule Take 1-2 tablets 3 times a day as needed for cough. Take this medication during the day or night. Patient not taking: Reported on 03/26/2022 01/25/22   Tyronda Vizcarrondo, Annie Main, FNP  hydroxypropyl methylcellulose / hypromellose (ISOPTO TEARS / GONIOVISC) 2.5 % ophthalmic solution Place 1 drop into the left eye as needed for dry eyes. Patient not taking: Reported on 04/13/2020 01/03/19   Milton Ferguson, MD  hydrOXYzine (ATARAX/VISTARIL) 10 MG tablet Take 1 tablet (10 mg total) by mouth at bedtime as needed and may repeat dose one time if needed. Patient not taking: Reported on 03/26/2022 04/13/20   Kris Hartmann, NP  loratadine (CLARITIN) 10 MG tablet Take 1 tablet (10 mg total) by mouth daily. 04/13/20   Kris Hartmann, NP  meloxicam (MOBIC) 15 MG tablet Take by mouth. Patient not taking: Reported on 03/26/2022 11/20/21 11/20/22  [provider]  ondansetron (ZOFRAN) 4 MG tablet Take 1 tablet (4 mg total) by mouth every 6 (six) hours. Patient not taking: Reported on 03/26/2022 01/25/22   Javae Braaten, Annie Main, FNP  traMADol (ULTRAM) 50 MG tablet Take 50 mg by mouth 2 (two) times daily as needed. Patient not taking: Reported on 03/26/2022 08/09/21   [provider]  valACYclovir (VALTREX) 1000 MG tablet Take one pill tid Patient not taking: Reported on 04/13/2020 01/03/19   Milton Ferguson, MD  varenicline (CHANTIX) 1 MG tablet TOME UNA TABLETA (1 MG TOTAL) POR VIA ORAL DOS VECES AL DIA 12/13/21   [provider]  Vitamin D, Ergocalciferol, (DRISDOL) 1.25 MG (50000 UNIT) CAPS capsule Take by mouth. 11/16/21   [provider]    Family History No family history on file.  Social History Social History    Tobacco Use   Smoking status: Former    Packs/day: 0.25    Years: 43.00    Total pack years: 10.75    Types: Cigarettes   Smokeless tobacco: Never   Tobacco comments:    since age 47  Vaping Use   Vaping Use: Never used  Substance Use Topics   Alcohol use: Yes    Comment: Holidays   Drug use: No     Allergies   Patient has no known allergies.   Review of Systems Review of Systems   Physical Exam Triage Vital Signs ED Triage Vitals [03/26/22 1605]  Enc Vitals Group     BP      Pulse      Resp      Temp      Temp src      SpO2      Weight      Height      Head Circumference      Peak Flow      Pain Score 6     Pain Loc      Pain Edu?      Excl. in [place]?    No data found.  Updated Vital Signs There were no vitals taken for this visit.  Visual Acuity Right Eye Distance:   Left Eye Distance:   Bilateral Distance:    Right Eye Near:   Left Eye Near:    Bilateral Near:     Physical Exam Vitals reviewed.  Constitutional:      Appearance: Normal appearance. She is ill-appearing.  Cardiovascular:     Rate and Rhythm: Normal rate and regular rhythm.     Pulses: Normal pulses.     Heart sounds: Normal heart sounds.  Pulmonary:     Effort: Pulmonary effort is normal.     Breath sounds: Normal breath sounds.  Skin:    General: Skin is warm and dry.  Neurological:     General: No focal deficit present.     Mental Status: She is alert and oriented to person, place, and time.  Psychiatric:        Mood and Affect: Mood normal.        Behavior: Behavior normal.      UC Treatments / Results  Labs (all labs ordered are listed, but only abnormal results are displayed) Labs Reviewed - No data to display  EKG   Radiology No results found.  Procedures Procedures (including critical care time)  Medications Ordered in UC Medications - No data to display  Initial Impression / Assessment and Plan / UC Course  I have reviewed the triage vital  signs and the nursing notes.  Pertinent labs & imaging results that were available during my care of the patient were reviewed by me and considered in my medical decision making (see chart for details).   Patient is afebrile here without recent antipyretics. Satting well on room air. Overall is ill appearing, well hydrated, without respiratory distress. Pulmonary exam is unremarkable.  Lungs CTAB without wheezing, rhonchi, rales.  Patient's symptoms are consistent with acute viral process including COVID for which she tested positive at home.  Patient requests treatment with Paxlovid.  She is 4 days after start of initial symptoms and does not have any respiratory distress.  Also recommending use of OTC medication for symptom control.  Providing Astelin spray for resolution of her rhinorrhea as well as cough medication.  Final Clinical Impressions(s) / UC Diagnoses   Final diagnoses:  None   Discharge Instructions   None    ED Prescriptions   None    PDMP not reviewed this encounter.   Rose Phi, [place] 03/26/22 1630

## 2022-03-26 NOTE — Discharge Instructions (Addendum)
You have been diagnosed with a viral upper respiratory infection based on your symptoms and exam. Viral illnesses cannot be treated with antibiotics - they are self limiting - and you should find your symptoms resolving within a few days. Get plenty of rest and non-caffeinated fluids. Watch for signs of dehydration including reduced urine output and dark colored urine.  We recommend you use over-the-counter medications for symptom control including acetaminophen (Tylenol), ibuprofen (Advil/Motrin) or naproxen (Aleve) for throat pain, fever, chills or body aches. You may combine use of acetaminophen and ibuprofen/naproxen if needed.  Some patients find an pain-relieving throat spray such as Chloraseptic to be effective.     Also recommend cold/cough medication containing a cough suppressant such as dextromethorphan, as needed.  Saline mist spray is helpful for removing excess mucus from your nose.  Room humidifiers are helpful to ease breathing at night. I recommend guaifenesin (Mucinex) with plenty of water throughout the day to help thin and loosen mucus secretions in your respiratory passages.   If appropriate based upon your other medical problems, you might also find relief of nasal/sinus congestion symptoms by using a nasal decongestant such as fluticasone (Flonase ) or pseudoephedrine (Sudafed sinus).  You will need to obtain Sudafed from behind the pharmacist counter.  Speak to the pharmacist to verify that you are not duplicating medications with other over-the-counter formulations that you may be using.   

## 2022-03-26 NOTE — ED Triage Notes (Signed)
Last week had body aches. Thursday, 03/22/2022 was the first day of symptoma   Yesterday felt worse.  Yesterday (03/25/2022) took a home test and it was positive .  Symptoms include cough, itchy throat, runny nose, no known fever, general aches and headache  Patient has taken nyquil, ibuprofen, tylenol

## 2022-08-09 ENCOUNTER — Encounter: Payer: Self-pay | Admitting: *Deleted

## 2022-08-10 ENCOUNTER — Ambulatory Visit: Payer: Commercial Managed Care - PPO | Admitting: Registered Nurse

## 2022-08-10 ENCOUNTER — Encounter: Payer: Self-pay | Admitting: *Deleted

## 2022-08-10 ENCOUNTER — Ambulatory Visit
Admission: RE | Admit: 2022-08-10 | Discharge: 2022-08-10 | Disposition: A | Discharge status: Home / Self Care | Payer: Commercial Managed Care - PPO | Attending: Gastroenterology | Admitting: Gastroenterology

## 2022-08-10 ENCOUNTER — Encounter
Admission: RE | Disposition: A | Discharge status: Home / Self Care | Payer: Self-pay | Source: Home / Self Care | Attending: Gastroenterology

## 2022-08-10 DIAGNOSIS — Z1211 Encounter for screening for malignant neoplasm of colon: Secondary | ICD-10-CM | POA: Insufficient documentation

## 2022-08-10 DIAGNOSIS — J449 Chronic obstructive pulmonary disease, unspecified: Secondary | ICD-10-CM | POA: Diagnosis not present

## 2022-08-10 DIAGNOSIS — K219 Gastro-esophageal reflux disease without esophagitis: Secondary | ICD-10-CM | POA: Diagnosis not present

## 2022-08-10 DIAGNOSIS — F17210 Nicotine dependence, cigarettes, uncomplicated: Secondary | ICD-10-CM | POA: Diagnosis not present

## 2022-08-10 DIAGNOSIS — Z90710 Acquired absence of both cervix and uterus: Secondary | ICD-10-CM | POA: Insufficient documentation

## 2022-08-10 DIAGNOSIS — Z6832 Body mass index (BMI) 32.0-32.9, adult: Secondary | ICD-10-CM | POA: Diagnosis not present

## 2022-08-10 HISTORY — PX: COLONOSCOPY WITH PROPOFOL: SHX5780

## 2022-08-10 SURGERY — COLONOSCOPY WITH PROPOFOL
Anesthesia: General

## 2022-08-10 MED ORDER — LIDOCAINE HCL (CARDIAC) PF 100 MG/5ML IV SOSY
PREFILLED_SYRINGE | INTRAVENOUS | Status: DC | PRN
  Administered 2022-08-10: 60 mg via INTRAVENOUS

## 2022-08-10 MED ORDER — SODIUM CHLORIDE 0.9 % IV SOLN
INTRAVENOUS | Status: DC
  Administered 2022-08-10: 1000 mL via INTRAVENOUS

## 2022-08-10 MED ORDER — PROPOFOL 500 MG/50ML IV EMUL
INTRAVENOUS | Status: DC | PRN
  Administered 2022-08-10: 100 ug/kg/min via INTRAVENOUS

## 2022-08-10 MED ORDER — PROPOFOL 10 MG/ML IV BOLUS
INTRAVENOUS | Status: DC | PRN
  Administered 2022-08-10: 80 mg via INTRAVENOUS

## 2022-08-10 NOTE — Anesthesia Postprocedure Evaluation (Signed)
Anesthesia Post Note  Patient: Laurie Gonzalez  Procedure(s) Performed: COLONOSCOPY WITH PROPOFOL  Patient location during evaluation: PACU Anesthesia Type: General Level of consciousness: awake and awake and alert Pain management: satisfactory to patient Vital Signs Assessment: post-procedure vital signs reviewed and stable Respiratory status: spontaneous breathing and nonlabored ventilation Cardiovascular status: stable Anesthetic complications: no   No notable events documented.   Last Vitals:  Vitals:   08/10/22 0904 08/10/22 0939  BP: (!) 85/45 (!) 102/49  Pulse:    Resp:    Temp: (!) 35.6 C   SpO2:      Last Pain:  Vitals:   08/10/22 0904  TempSrc: Temporal  PainSc: Asleep                 VAN STAVEREN,Yui Mulvaney

## 2022-08-10 NOTE — Op Note (Signed)
[facility] Gastroenterology Patient Name: Laurie Gonzalez Procedure Date: 08/10/2022 8:45 AM MRN: [id number] Account #: [id number] Date of Birth: December 18, 1955 Admit Type: Outpatient Age: 67 Room: [facility] ENDO ROOM 3 Gender: Female Note Status: Finalized Instrument Name: Prentice Docker [id number] Procedure:             Colonoscopy Indications:           Surveillance: Personal history of adenomatous polyps                         on last colonoscopy > 5 years ago Providers:             Eather Colas MD, MD Referring MD:          Barbette Reichmann, MD (Referring MD) Medicines:             Monitored Anesthesia Care Complications:         No immediate complications. Procedure:             Pre-Anesthesia Assessment:                        - Prior to the procedure, a History and Physical was                         performed, and patient medications and allergies were                         reviewed. The patient is competent. The risks and                         benefits of the procedure and the sedation options and                         risks were discussed with the patient. All questions                         were answered and informed consent was obtained.                         Patient identification and proposed procedure were                         verified by the physician, the nurse, the                         anesthesiologist, the anesthetist and the technician                         in the endoscopy suite. Mental Status Examination:                         alert and oriented. Airway Examination: normal                         oropharyngeal airway and neck mobility. Respiratory                         Examination: clear to auscultation. CV Examination:  normal. Prophylactic Antibiotics: The patient does not                         require prophylactic antibiotics. Prior                         Anticoagulants: The patient has taken no  anticoagulant                         or antiplatelet agents. ASA Grade Assessment: II - A                         patient with mild systemic disease. After reviewing                         the risks and benefits, the patient was deemed in                         satisfactory condition to undergo the procedure. The                         anesthesia plan was to use monitored anesthesia care                         (MAC). Immediately prior to administration of                         medications, the patient was re-assessed for adequacy                         to receive sedatives. The heart rate, respiratory                         rate, oxygen saturations, blood pressure, adequacy of                         pulmonary ventilation, and response to care were                         monitored throughout the procedure. The physical                         status of the patient was re-assessed after the                         procedure.                        After obtaining informed consent, the colonoscope was                         passed under direct vision. Throughout the procedure,                         the patient's blood pressure, pulse, and oxygen                         saturations were monitored continuously. The  Colonoscope was introduced through the anus and                         advanced to the the cecum, identified by appendiceal                         orifice and ileocecal valve. The colonoscopy was                         performed without difficulty. The patient tolerated                         the procedure well. The quality of the bowel                         preparation was inadequate. The ileocecal valve,                         appendiceal orifice, and rectum were photographed. Findings:      The perianal and digital rectal examinations were normal.      The entire examined colon appeared normal on direct and retroflexion        views. Impression:            - Preparation of the colon was inadequate.                        - The entire examined colon is normal on direct and                         retroflexion views.                        - No specimens collected. Recommendation:        - Discharge patient to home.                        - Resume previous diet.                        - Continue present medications.                        - Repeat colonoscopy in 6 months because the bowel                         preparation was suboptimal.                        - Return to referring physician as previously                         scheduled. Procedure Code(s):     --- Professional ---                        Z6109, Colorectal cancer screening; colonoscopy on                         individual at high risk Diagnosis Code(s):     --- Professional ---  Z86.010, Personal history of colonic polyps CPT copyright 2022 [employer]. All rights reserved. The codes documented in this report are preliminary and upon coder review may  be revised to meet current compliance requirements. Eather Colas MD, MD 08/10/2022 9:03:26 AM Number of Addenda: 0 Note Initiated On: 08/10/2022 8:45 AM Scope Withdrawal Time: 0 hours 5 minutes 20 seconds  Total Procedure Duration: 0 hours 8 minutes 10 seconds  Estimated Blood Loss:  Estimated blood loss: none.      [facility]

## 2022-08-10 NOTE — H&P (Signed)
Outpatient short stay form Pre-procedure 08/10/2022  Laurie Bill, MD  Primary Physician: Laurie Reichmann, MD  Reason for visit:  Surveillance colonoscopy  History of present illness:    67 y/o lady with history of obesity here for surveillance colonoscopy. Last colonoscopy in 2017 with small TA. No blood thinners. No family history of colon cancer. Patient has history of hysterectomy.   No current facility-administered medications for this encounter.  Medications Prior to Admission  Medication Sig Dispense Refill Last Dose   Vitamin D, Ergocalciferol, (DRISDOL) 1.25 MG (50000 UNIT) CAPS capsule Take by mouth.   08/09/2022   albuterol (VENTOLIN HFA) 108 (90 Base) MCG/ACT inhaler Inhale into the lungs.    at prn   artificial tears (LACRILUBE) OINT ophthalmic ointment Use in left eye and tape shut at night (Patient not taking: Reported on 04/13/2020) 1 g 1    azelastine (ASTELIN) 0.1 % nasal spray Place 1 spray into both nostrils 2 (two) times daily for 7 days. Use in each nostril as directed 30 mL 12    benzonatate (TESSALON) 100 MG capsule Take 1-2 tablets 3 times a day as needed for cough 30 capsule 0  at prn   hydroxypropyl methylcellulose / hypromellose (ISOPTO TEARS / GONIOVISC) 2.5 % ophthalmic solution Place 1 drop into the left eye as needed for dry eyes. (Patient not taking: Reported on 04/13/2020) 15 mL 12    hydrOXYzine (ATARAX/VISTARIL) 10 MG tablet Take 1 tablet (10 mg total) by mouth at bedtime as needed and may repeat dose one time if needed. (Patient not taking: Reported on 03/26/2022) 30 tablet 3    loratadine (CLARITIN) 10 MG tablet Take 1 tablet (10 mg total) by mouth daily. (Patient not taking: Reported on 08/10/2022) 30 tablet 6 Not Taking   meloxicam (MOBIC) 15 MG tablet Take by mouth. (Patient not taking: Reported on 03/26/2022)      nirmatrelvir & ritonavir (PAXLOVID, 300/100,) 20 x 150 MG & 10 x 100MG  TBPK Follow packaging directions for morning and evening doses.  (Patient not taking: Reported on 08/10/2022) 30 tablet 0 Not Taking   ondansetron (ZOFRAN) 4 MG tablet Take 1 tablet (4 mg total) by mouth every 6 (six) hours. (Patient not taking: Reported on 03/26/2022) 12 tablet 0    traMADol (ULTRAM) 50 MG tablet Take 50 mg by mouth 2 (two) times daily as needed. (Patient not taking: Reported on 03/26/2022)      valACYclovir (VALTREX) 1000 MG tablet Take one pill tid (Patient not taking: Reported on 04/13/2020) 20 tablet 0    varenicline (CHANTIX) 1 MG tablet TOME UNA TABLETA (1 MG TOTAL) POR VIA ORAL DOS VECES AL DIA (Patient not taking: Reported on 08/10/2022)   Not Taking     No Known Allergies   Past Medical History:  Diagnosis Date   Arthritis    knee   GERD (gastroesophageal reflux disease)     Review of systems:  Otherwise negative.    Physical Exam  Gen: Alert, oriented. Appears stated age.  HEENT: PERRLA. Lungs: No respiratory distress CV: RRR Abd: soft, benign, no masses Ext: No edema    Planned procedures: Proceed with colonoscopy. The patient understands the nature of the planned procedure, indications, risks, alternatives and potential complications including but not limited to bleeding, infection, perforation, damage to internal organs and possible oversedation/side effects from anesthesia. The patient agrees and gives consent to proceed.  Please refer to procedure notes for findings, recommendations and patient disposition/instructions.     Laurie Gonzalez  Mia Creek, MD [facility] Gastroenterology

## 2022-08-10 NOTE — Anesthesia Preprocedure Evaluation (Signed)
Anesthesia Evaluation  Patient identified by MRN, date of birth, ID band Patient awake    Reviewed: Allergy & Precautions, NPO status , Patient's Chart, lab work & pertinent test results  Airway Mallampati: III  TM Distance: >3 FB Neck ROM: full    Dental  (+) Caps,    Pulmonary neg pulmonary ROS, COPD, Current Smoker and Patient abstained from smoking.   Pulmonary exam normal  + decreased breath sounds      Cardiovascular Exercise Tolerance: Good negative cardio ROS Normal cardiovascular exam Rhythm:Regular Rate:Normal     Neuro/Psych negative neurological ROS  negative psych ROS   GI/Hepatic negative GI ROS, Neg liver ROS,GERD  Medicated,,  Endo/Other  negative endocrine ROS  Morbid obesity  Renal/GU negative Renal ROS  negative genitourinary   Musculoskeletal   Abdominal  (+) + obese  Peds negative pediatric ROS (+)  Hematology negative hematology ROS (+)   Anesthesia Other Findings Past Medical History: No date: Arthritis     Comment:  knee No date: GERD (gastroesophageal reflux disease)  Past Surgical History: No date: ABDOMINAL HYSTERECTOMY 05/13/2015: COLONOSCOPY WITH PROPOFOL; N/A     Comment:  Procedure: COLONOSCOPY WITH PROPOFOL;  Surgeon: Scot Jun, MD;  Location: [facility] ENDOSCOPY;  Service:               Endoscopy;  Laterality: N/A; 05/13/2015: ESOPHAGOGASTRODUODENOSCOPY (EGD) WITH PROPOFOL; N/A     Comment:  Procedure: ESOPHAGOGASTRODUODENOSCOPY (EGD) WITH               PROPOFOL;  Surgeon: Scot Jun, MD;  Location: [facility]              ENDOSCOPY;  Service: Endoscopy;  Laterality: N/A; No date: KNEE ARTHROSCOPY 06/26/2017: KNEE ARTHROSCOPY; Right     Comment:  Procedure: ARTHROSCOPY KNEE WITH DEBRIDEMENT AND REPAIR               VS PARTIAL MEDIAL MENISCECTOMY;  Surgeon: Christena Flake,               MD;  Location: [facility];  Service:               Orthopedics;   Laterality: Right;  BMI    Body Mass Index: 32.65 kg/m      Reproductive/Obstetrics negative OB ROS                              Anesthesia Physical Anesthesia Plan  ASA: 3  Anesthesia Plan: General   Post-op Pain Management:    Induction:   PONV Risk Score and Plan: Propofol infusion and TIVA  Airway Management Planned: Natural Airway  Additional Equipment:   Intra-op Plan:   Post-operative Plan:   Informed Consent: I have reviewed the patients History and Physical, chart, labs and discussed the procedure including the risks, benefits and alternatives for the proposed anesthesia with the patient or authorized representative who has indicated his/her understanding and acceptance.     Dental Advisory Given  Plan Discussed with: CRNA and Surgeon  Anesthesia Plan Comments:          Anesthesia Quick Evaluation

## 2022-08-10 NOTE — Transfer of Care (Signed)
Immediate Anesthesia Transfer of Care Note  Patient: Laurie Gonzalez  Procedure(s) Performed: COLONOSCOPY WITH PROPOFOL  Patient Location: PACU  Anesthesia Type:General  Level of Consciousness: drowsy  Airway & Oxygen Therapy: Patient Spontanous Breathing and Patient connected to nasal cannula oxygen  Post-op Assessment: Report given to RN and Post -op Vital signs reviewed and stable  Post vital signs: stable  Last Vitals:  Vitals Value Taken Time  BP 84/46 08/10/22 0904  Temp 35.6 C 08/10/22 0904  Pulse 74 08/10/22 0906  Resp 23 08/10/22 0906  SpO2 99 % 08/10/22 0906  Vitals shown include unvalidated device data.  Last Pain:  Vitals:   08/10/22 0904  TempSrc: Temporal  PainSc: Asleep         Complications: No notable events documented.

## 2022-08-10 NOTE — Interval H&P Note (Signed)
History and Physical Interval Note:  08/10/2022 8:32 AM  Laurie Gonzalez  has presented today for surgery, with the diagnosis of Hx TA Polyps.  The various methods of treatment have been discussed with the patient and family. After consideration of risks, benefits and other options for treatment, the patient has consented to  Procedure(s) with comments: COLONOSCOPY WITH PROPOFOL (N/A) - SPANISH - CALL DAUGHTER KARLA [phone] WITH TIME as a surgical intervention.  The patient's history has been reviewed, patient examined, no change in status, stable for surgery.  I have reviewed the patient's chart and labs.  Questions were answered to the patient's satisfaction.     Regis Bill  Ok to proceed with colonoscopy

## 2022-08-11 NOTE — Progress Notes (Signed)
Patient contacted by Ashby Dawes [facility] Interpreter).

## 2022-08-13 ENCOUNTER — Encounter: Payer: Self-pay | Admitting: Gastroenterology

## 2022-11-27 ENCOUNTER — Other Ambulatory Visit: Payer: Self-pay

## 2022-11-27 ENCOUNTER — Emergency Department
Admission: EM | Admit: 2022-11-27 | Discharge: 2022-11-27 | Disposition: A | Discharge status: Home / Self Care | Payer: Commercial Managed Care - PPO | Attending: Emergency Medicine | Admitting: Emergency Medicine

## 2022-11-27 ENCOUNTER — Emergency Department: Payer: Commercial Managed Care - PPO

## 2022-11-27 DIAGNOSIS — S0990XA Unspecified injury of head, initial encounter: Secondary | ICD-10-CM | POA: Insufficient documentation

## 2022-11-27 DIAGNOSIS — Y9366 Activity, soccer: Secondary | ICD-10-CM | POA: Insufficient documentation

## 2022-11-27 DIAGNOSIS — F0781 Postconcussional syndrome: Secondary | ICD-10-CM | POA: Diagnosis not present

## 2022-11-27 DIAGNOSIS — Y92322 Soccer field as the place of occurrence of the external cause: Secondary | ICD-10-CM | POA: Insufficient documentation

## 2022-11-27 DIAGNOSIS — M542 Cervicalgia: Secondary | ICD-10-CM | POA: Diagnosis not present

## 2022-11-27 DIAGNOSIS — W2102XA Struck by soccer ball, initial encounter: Secondary | ICD-10-CM | POA: Insufficient documentation

## 2022-11-27 LAB — CBC
HCT: 39.2 % (ref 36.0–46.0)
Hemoglobin: 13 g/dL (ref 12.0–15.0)
MCH: 30.6 pg (ref 26.0–34.0)
MCHC: 33.2 g/dL (ref 30.0–36.0)
MCV: 92.2 fL (ref 80.0–100.0)
Platelets: 300 10*3/uL (ref 150–400)
RBC: 4.25 MIL/uL (ref 3.87–5.11)
RDW: 13.4 % (ref 11.5–15.5)
WBC: 6.1 10*3/uL (ref 4.0–10.5)
nRBC: 0 % (ref 0.0–0.2)

## 2022-11-27 LAB — BASIC METABOLIC PANEL
Anion gap: 8 (ref 5–15)
BUN: 13 mg/dL (ref 8–23)
CO2: 25 mmol/L (ref 22–32)
Calcium: 9 mg/dL (ref 8.9–10.3)
Chloride: 107 mmol/L (ref 98–111)
Creatinine, Ser: 0.58 mg/dL (ref 0.44–1.00)
GFR, Estimated: >60 mL/min (ref >60)
Glucose, Bld: 112 mg/dL — ABNORMAL HIGH (ref 70–99)
Potassium: 4 mmol/L (ref 3.5–5.1)
Sodium: 140 mmol/L (ref 135–145)

## 2022-11-27 MED ORDER — PROCHLORPERAZINE MALEATE 10 MG PO TABS: 10.0000 mg | ORAL_TABLET | Freq: Four times a day (QID) | ORAL | 0 refills | Status: AC | PRN

## 2022-11-27 MED ORDER — DIPHENHYDRAMINE HCL 25 MG PO CAPS
25.0000 mg | ORAL_CAPSULE | Freq: Once | ORAL | Status: AC
  Administered 2022-11-27: 25 mg via ORAL
  Filled 2022-11-27: qty 1

## 2022-11-27 MED ORDER — IBUPROFEN 400 MG PO TABS
400.0000 mg | ORAL_TABLET | Freq: Once | ORAL | Status: AC
  Administered 2022-11-27: 400 mg via ORAL
  Filled 2022-11-27: qty 1

## 2022-11-27 MED ORDER — PROCHLORPERAZINE MALEATE 10 MG PO TABS
10.0000 mg | ORAL_TABLET | Freq: Once | ORAL | Status: AC
  Administered 2022-11-27: 10 mg via ORAL
  Filled 2022-11-27: qty 1

## 2022-11-27 NOTE — ED Triage Notes (Addendum)
Pt comes with c/o head injury. Pt states on Sunday she got hit in the head with a soccer ball. Pt states some neck pain. Pt states yesterday she got dizzy and felt the room was spinning. Pt states also having left arm numbness and pain.

## 2022-11-27 NOTE — ED Provider Notes (Signed)
[facility] Provider Note    Event Date/Time   First MD Initiated Contact with Patient 11/27/22 [phone]     (approximate)   History   Chief Complaint Head Injury   HPI  Laurie Gonzalez is a 67 y.o. female with past medical history of lumbar radiculopathy and GERD who presents to the ED complaining of head injury.  Patient reports that 2 days ago she was struck in the backside of her head with a soccer ball, denies losing consciousness.  Since then, she has been dealing with pain in the middle of her neck as well as the posterior portion of her head.  This been associated with nausea and dizziness, which she describes as feeling like the room is spinning around her.  She has not had any vomiting or vision changes, does report numbness and tingling in both hands but denies any weakness in her arms or legs.  She has been taking over-the-counter medication for this without relief.     Physical Exam   Triage Vital Signs: ED Triage Vitals  Encounter Vitals Group     BP 11/27/22 0730 138/79     Systolic BP Percentile --      Diastolic BP Percentile --      Pulse Rate 11/27/22 0730 83     Resp 11/27/22 0730 18     Temp 11/27/22 0730 98 F (36.7 C)     Temp src --      SpO2 11/27/22 0730 100 %     Weight 11/27/22 0735 165 lb (74.8 kg)     Height 11/27/22 0735 5\' 4"  (1.626 m)     Head Circumference --      Peak Flow --      Pain Score 11/27/22 0733 6     Pain Loc --      Pain Education --      Exclude from Growth Chart --     Most recent vital signs: Vitals:   11/27/22 0730  BP: 138/79  Pulse: 83  Resp: 18  Temp: 98 F (36.7 C)  SpO2: 100%    Constitutional: Alert and oriented. Eyes: Conjunctivae are normal.  Pupils equal, round, and reactive to light bilaterally. Head: Atraumatic. Nose: No congestion/rhinnorhea. Mouth/Throat: Mucous membranes are moist.  Neck: Midline cervical spine tenderness to palpation noted. Cardiovascular: Normal rate,  regular rhythm. Grossly normal heart sounds.  2+ radial pulses bilaterally. Respiratory: Normal respiratory effort.  No retractions. Lungs CTAB. Gastrointestinal: Soft and nontender. No distention. Musculoskeletal: No lower extremity tenderness nor edema.  Neurologic:  Normal speech and language. No gross focal neurologic deficits are appreciated.    ED Results / Procedures / Treatments   Labs (all labs ordered are listed, but only abnormal results are displayed) Labs Reviewed  BASIC METABOLIC PANEL - Abnormal; Notable for the following components:      Result Value   Glucose, Bld 112 (*)    All other components within normal limits  CBC  URINALYSIS, ROUTINE W REFLEX MICROSCOPIC  CBG MONITORING, ED     EKG  ED ECG REPORT I, Chesley Noon, the attending physician, personally viewed and interpreted this ECG.   Date: 11/27/2022  EKG Time: 7:35  Rate: 69  Rhythm: normal sinus rhythm  Axis: Normal  Intervals:right bundle branch block  ST&T Change: None  RADIOLOGY CT head reviewed and interpreted by me with no hemorrhage or midline shift.  PROCEDURES:  Critical Care performed: No  Procedures   MEDICATIONS ORDERED IN  ED: Medications  prochlorperazine (COMPAZINE) tablet 10 mg (10 mg Oral Given 11/27/22 0858)  diphenhydrAMINE (BENADRYL) capsule 25 mg (25 mg Oral Given 11/27/22 0838)  ibuprofen (ADVIL) tablet 400 mg (400 mg Oral Given 11/27/22 0837)     IMPRESSION / MDM / ASSESSMENT AND PLAN / ED COURSE  I reviewed the triage vital signs and the nursing notes.                              67 y.o. female with past medical history of lumbar radiculopathy and GERD who presents to the ED complaining of posterior headache and neck pain along with nausea and dizziness since being struck by a soccer ball 2 days ago.  Patient's presentation is most consistent with acute presentation with potential threat to life or bodily function.  Differential diagnosis includes, but is  not limited to, intracranial injury, cervical spine injury, concussion, anemia, electrolyte abnormality, AKI.  Patient well-appearing and in no acute distress, vital signs are unremarkable.  She has a nonfocal neurologic exam and CT head and cervical spine are negative for acute traumatic injury, EKG shows no evidence of arrhythmia or ischemia, and labs show no significant anemia, leukocytosis, tract abnormality, or AKI.  Symptoms seem most consistent with concussion and we will treat with Compazine, Benadryl, and ibuprofen.  Patient was feeling better following Compazine, Benadryl, and ibuprofen.  Suspect postconcussive syndrome and patient appropriate for outpatient management with neurology follow-up as needed.  Will prescribe Compazine and patient counseled on alternating Tylenol and ibuprofen as needed.  She was counseled to return to the ED for new or worsening symptoms, patient agrees with plan.      FINAL CLINICAL IMPRESSION(S) / ED DIAGNOSES   Final diagnoses:  Post concussion syndrome     Rx / DC Orders   ED Discharge Orders          Ordered    prochlorperazine (COMPAZINE) 10 MG tablet  Every 6 hours PRN        11/27/22 0954             Note:  This document was prepared using Dragon voice recognition software and may include unintentional dictation errors.   Chesley Noon, MD 11/27/22 [phone]

## 2023-01-10 ENCOUNTER — Encounter: Payer: Self-pay | Admitting: *Deleted

## 2023-01-11 ENCOUNTER — Other Ambulatory Visit: Payer: Self-pay | Admitting: Internal Medicine

## 2023-01-11 DIAGNOSIS — Z1231 Encounter for screening mammogram for malignant neoplasm of breast: Secondary | ICD-10-CM

## 2023-01-25 ENCOUNTER — Ambulatory Visit: Payer: Commercial Managed Care - PPO | Admitting: Anesthesiology

## 2023-01-25 ENCOUNTER — Encounter
Admission: RE | Disposition: A | Discharge status: Home / Self Care | Payer: Self-pay | Source: Home / Self Care | Attending: Gastroenterology

## 2023-01-25 ENCOUNTER — Ambulatory Visit
Admission: RE | Admit: 2023-01-25 | Discharge: 2023-01-25 | Disposition: A | Discharge status: Home / Self Care | Payer: Commercial Managed Care - PPO | Attending: Gastroenterology | Admitting: Gastroenterology

## 2023-01-25 ENCOUNTER — Encounter: Payer: Self-pay | Admitting: *Deleted

## 2023-01-25 DIAGNOSIS — K219 Gastro-esophageal reflux disease without esophagitis: Secondary | ICD-10-CM | POA: Insufficient documentation

## 2023-01-25 DIAGNOSIS — Z1211 Encounter for screening for malignant neoplasm of colon: Secondary | ICD-10-CM | POA: Insufficient documentation

## 2023-01-25 DIAGNOSIS — K640 First degree hemorrhoids: Secondary | ICD-10-CM | POA: Diagnosis not present

## 2023-01-25 DIAGNOSIS — Z860101 Personal history of adenomatous and serrated colon polyps: Secondary | ICD-10-CM | POA: Insufficient documentation

## 2023-01-25 DIAGNOSIS — K621 Rectal polyp: Secondary | ICD-10-CM | POA: Insufficient documentation

## 2023-01-25 DIAGNOSIS — F17200 Nicotine dependence, unspecified, uncomplicated: Secondary | ICD-10-CM | POA: Diagnosis not present

## 2023-01-25 DIAGNOSIS — Z90710 Acquired absence of both cervix and uterus: Secondary | ICD-10-CM | POA: Insufficient documentation

## 2023-01-25 HISTORY — PX: POLYPECTOMY: SHX5525

## 2023-01-25 HISTORY — PX: COLONOSCOPY WITH PROPOFOL: SHX5780

## 2023-01-25 SURGERY — COLONOSCOPY WITH PROPOFOL
Anesthesia: General

## 2023-01-25 MED ORDER — PROPOFOL 10 MG/ML IV BOLUS
INTRAVENOUS | Status: AC
  Filled 2023-01-25: qty 40

## 2023-01-25 MED ORDER — SODIUM CHLORIDE 0.9 % IV SOLN: INTRAVENOUS | Status: DC

## 2023-01-25 MED ORDER — LIDOCAINE HCL (CARDIAC) PF 100 MG/5ML IV SOSY
PREFILLED_SYRINGE | INTRAVENOUS | Status: DC | PRN
  Administered 2023-01-25: 50 mg via INTRAVENOUS

## 2023-01-25 MED ORDER — PROPOFOL 10 MG/ML IV BOLUS
INTRAVENOUS | Status: DC | PRN
  Administered 2023-01-25: 40 mg via INTRAVENOUS
  Administered 2023-01-25: 100 ug/kg/min via INTRAVENOUS
  Administered 2023-01-25: 80 mg via INTRAVENOUS

## 2023-01-25 MED ORDER — PHENYLEPHRINE 80 MCG/ML (10ML) SYRINGE FOR IV PUSH (FOR BLOOD PRESSURE SUPPORT)
PREFILLED_SYRINGE | INTRAVENOUS | Status: DC | PRN
  Administered 2023-01-25 (×2): 80 ug via INTRAVENOUS

## 2023-01-25 MED ORDER — PHENYLEPHRINE 80 MCG/ML (10ML) SYRINGE FOR IV PUSH (FOR BLOOD PRESSURE SUPPORT)
PREFILLED_SYRINGE | INTRAVENOUS | Status: AC
  Filled 2023-01-25: qty 10

## 2023-01-25 NOTE — Op Note (Signed)
[facility] Gastroenterology Patient Name: Laurie Gonzalez Procedure Date: 01/25/2023 8:12 AM MRN: [id number] Account #: [id number] Date of Birth: 1955/08/25 Admit Type: Outpatient Age: 67 Room: [facility] ENDO ROOM 3 Gender: Female Note Status: Finalized Instrument Name: Nelda Marseille [id number] Procedure:             Colonoscopy Indications:           Surveillance: Personal history of adenomatous polyps,                         inadequate prep on last colonoscopy (less than 1 year                         ago) Providers:             Eather Colas MD, MD Medicines:             Monitored Anesthesia Care Complications:         No immediate complications. Estimated blood loss:                         Minimal. Procedure:             Pre-Anesthesia Assessment:                        - Prior to the procedure, a History and Physical was                         performed, and patient medications and allergies were                         reviewed. The patient is competent. The risks and                         benefits of the procedure and the sedation options and                         risks were discussed with the patient. All questions                         were answered and informed consent was obtained.                         Patient identification and proposed procedure were                         verified by the physician, the nurse, the                         anesthesiologist, the anesthetist and the technician                         in the endoscopy suite. Mental Status Examination:                         alert and oriented. Airway Examination: normal                         oropharyngeal airway and neck mobility. Respiratory  Examination: clear to auscultation. CV Examination:                         normal. Prophylactic Antibiotics: The patient does not                         require prophylactic antibiotics. Prior                          Anticoagulants: The patient has taken no anticoagulant                         or antiplatelet agents. ASA Grade Assessment: III - A                         patient with severe systemic disease. After reviewing                         the risks and benefits, the patient was deemed in                         satisfactory condition to undergo the procedure. The                         anesthesia plan was to use monitored anesthesia care                         (MAC). Immediately prior to administration of                         medications, the patient was re-assessed for adequacy                         to receive sedatives. The heart rate, respiratory                         rate, oxygen saturations, blood pressure, adequacy of                         pulmonary ventilation, and response to care were                         monitored throughout the procedure. The physical                         status of the patient was re-assessed after the                         procedure.                        After obtaining informed consent, the colonoscope was                         passed under direct vision. Throughout the procedure,                         the patient's blood pressure, pulse, and oxygen  saturations were monitored continuously. The                         Colonoscope was introduced through the anus and                         advanced to the the cecum, identified by appendiceal                         orifice and ileocecal valve. The colonoscopy was                         performed without difficulty. The patient tolerated                         the procedure well. The quality of the bowel                         preparation was adequate to identify polyps. The                         ileocecal valve, appendiceal orifice, and rectum were                         photographed. Findings:      The perianal and digital rectal examinations were normal.       Two hyperplastic polyps were found in the rectum. The polyps were 1 to 2       mm in size. These polyps were removed with a jumbo cold forceps.       Resection and retrieval were complete. Estimated blood loss was minimal.      Internal hemorrhoids were found during retroflexion. The hemorrhoids       were Grade I (internal hemorrhoids that do not prolapse).      The exam was otherwise without abnormality on direct and retroflexion       views. Impression:            - Two 1 to 2 mm polyps in the rectum, removed with a                         jumbo cold forceps. Resected and retrieved.                        - Internal hemorrhoids.                        - The examination was otherwise normal on direct and                         retroflexion views. Recommendation:        - Discharge patient to home.                        - Resume previous diet.                        - Continue present medications.                        - Await pathology results.                        -  Repeat colonoscopy for surveillance based on                         pathology results.                        - Return to referring physician as previously                         scheduled. Procedure Code(s):     --- Professional ---                        [phone], Colonoscopy, flexible; with biopsy, single or                         multiple Diagnosis Code(s):     --- Professional ---                        Z86.010, Personal history of colonic polyps                        D12.8, Benign neoplasm of rectum                        K64.0, First degree hemorrhoids CPT copyright 2022 American [employer] Association. All rights reserved. The codes documented in this report are preliminary and upon coder review may  be revised to meet current compliance requirements. Eather Colas MD, MD 01/25/2023 8:36:51 AM Number of Addenda: 0 Note Initiated On: 01/25/2023 8:12 AM Scope Withdrawal Time: 0 hours 11 minutes 43 seconds   Total Procedure Duration: 0 hours 15 minutes 9 seconds  Estimated Blood Loss:  Estimated blood loss was minimal.      [facility]

## 2023-01-25 NOTE — Interval H&P Note (Signed)
History and Physical Interval Note:  01/25/2023 8:09 AM  Laurie Gonzalez  has presented today for surgery, with the diagnosis of ph colon polyps.  The various methods of treatment have been discussed with the patient and family. After consideration of risks, benefits and other options for treatment, the patient has consented to  Procedure(s): COLONOSCOPY WITH PROPOFOL (N/A) as a surgical intervention.  The patient's history has been reviewed, patient examined, no change in status, stable for surgery.  I have reviewed the patient's chart and labs.  Questions were answered to the patient's satisfaction.     Regis Bill  Ok to proceed with colonoscopy

## 2023-01-25 NOTE — Anesthesia Preprocedure Evaluation (Addendum)
Anesthesia Evaluation  Patient identified by MRN, date of birth, ID band Patient awake    Reviewed: Allergy & Precautions, NPO status , Patient's Chart, lab work & pertinent test results  Airway Mallampati: II  TM Distance: >3 FB Neck ROM: full    Dental  (+) Teeth Intact   Pulmonary neg pulmonary ROS, COPD, Current Smoker and Patient abstained from smoking.   Pulmonary exam normal  + decreased breath sounds      Cardiovascular Exercise Tolerance: Good negative cardio ROS Normal cardiovascular exam Rhythm:Regular Rate:Normal     Neuro/Psych negative neurological ROS  negative psych ROS   GI/Hepatic negative GI ROS, Neg liver ROS,GERD  Medicated,,  Endo/Other  negative endocrine ROS    Renal/GU negative Renal ROS  negative genitourinary   Musculoskeletal   Abdominal  (+) + obese  Peds negative pediatric ROS (+)  Hematology negative hematology ROS (+)   Anesthesia Other Findings Past Medical History: No date: Arthritis     Comment:  knee No date: GERD (gastroesophageal reflux disease)  Past Surgical History: No date: ABDOMINAL HYSTERECTOMY 05/13/2015: COLONOSCOPY WITH PROPOFOL; N/A     Comment:  Procedure: COLONOSCOPY WITH PROPOFOL;  Surgeon: Scot Jun, MD;  Location: [facility] ENDOSCOPY;  Service:               Endoscopy;  Laterality: N/A; 08/10/2022: COLONOSCOPY WITH PROPOFOL; N/A     Comment:  Procedure: COLONOSCOPY WITH PROPOFOL;  Surgeon:               Regis Bill, MD;  Location: [facility] ENDOSCOPY;                Service: Endoscopy;  Laterality: N/ASherrie Sport [phone] WITH TIME 05/13/2015: ESOPHAGOGASTRODUODENOSCOPY (EGD) WITH PROPOFOL; N/A     Comment:  Procedure: ESOPHAGOGASTRODUODENOSCOPY (EGD) WITH               PROPOFOL;  Surgeon: Scot Jun, MD;  Location: [facility]              ENDOSCOPY;  Service: Endoscopy;  Laterality: N/A; No date:  KNEE ARTHROSCOPY 06/26/2017: KNEE ARTHROSCOPY; Right     Comment:  Procedure: ARTHROSCOPY KNEE WITH DEBRIDEMENT AND REPAIR               VS PARTIAL MEDIAL MENISCECTOMY;  Surgeon: Christena Flake,               MD;  Location: [facility];  Service:               Orthopedics;  Laterality: Right; No date: neck pain; Left     Comment:  "hit by a ball at a game and took Prednisone and better               now"     Reproductive/Obstetrics negative OB ROS                             Anesthesia Physical Anesthesia Plan  ASA: 3  Anesthesia Plan: General   Post-op Pain Management:    Induction: Intravenous  PONV Risk Score and Plan: Propofol infusion and TIVA  Airway Management Planned: Natural Airway and Nasal Cannula  Additional Equipment:   Intra-op Plan:   Post-operative Plan:   Informed Consent:  I have reviewed the patients History and Physical, chart, labs and discussed the procedure including the risks, benefits and alternatives for the proposed anesthesia with the patient or authorized representative who has indicated his/her understanding and acceptance.     Dental Advisory Given  Plan Discussed with: CRNA and Surgeon  Anesthesia Plan Comments:        Anesthesia Quick Evaluation

## 2023-01-25 NOTE — Anesthesia Postprocedure Evaluation (Signed)
Anesthesia Post Note  Patient: Laurie Gonzalez  Procedure(s) Performed: COLONOSCOPY WITH PROPOFOL  Patient location during evaluation: PACU Anesthesia Type: General Level of consciousness: awake and awake and alert Pain management: satisfactory to patient Vital Signs Assessment: post-procedure vital signs reviewed and stable Respiratory status: spontaneous breathing Cardiovascular status: stable Anesthetic complications: no   No notable events documented.   Last Vitals:  Vitals:   01/25/23 0838 01/25/23 0857  BP: (!) 113/52 118/61  Pulse:    Resp:    Temp: (!) 35.9 C   SpO2:      Last Pain:  Vitals:   01/25/23 0857  TempSrc:   PainSc: 0-No pain                 VAN STAVEREN,Gaye Scorza

## 2023-01-25 NOTE — Transfer of Care (Signed)
Immediate Anesthesia Transfer of Care Note  Patient: Laurie Gonzalez  Procedure(s) Performed: COLONOSCOPY WITH PROPOFOL  Patient Location: PACU  Anesthesia Type:MAC  Level of Consciousness: awake  Airway & Oxygen Therapy: Patient Spontanous Breathing  Post-op Assessment: Report given to RN and Post -op Vital signs reviewed and stable  Post vital signs: Reviewed and stable  Last Vitals:  Vitals Value Taken Time  BP 113/52 01/25/23 0840  Temp    Pulse 66 01/25/23 0840  Resp 22 01/25/23 0840  SpO2 98 % 01/25/23 0840  Vitals shown include unfiled device data.  Last Pain:  Vitals:   01/25/23 0705  TempSrc: Temporal         Complications: No notable events documented.

## 2023-01-25 NOTE — H&P (Signed)
Outpatient short stay form Pre-procedure 01/25/2023  Regis Bill, MD  Primary Physician: Barbette Reichmann, MD  Reason for visit:  Surveillance  History of present illness:    67 y/o lady with history of obesity here for surveillance colonoscopy. Had poor prep on colonoscopy 6 months ago. Colonoscopy in 2017 with small TA. No blood thinners. No family history of colon cancer. Patient has history of hysterectomy.     Current Facility-Administered Medications:    0.9 %  sodium chloride infusion, , Intravenous, Continuous, Iyana Topor, [name], MD, Last Rate: 20 mL/hr at 01/25/23 0729, New Bag at 01/25/23 0729  Medications Prior to Admission  Medication Sig Dispense Refill Last Dose   albuterol (VENTOLIN HFA) 108 (90 Base) MCG/ACT inhaler Inhale into the lungs.   Past Month   Vitamin D, Ergocalciferol, (DRISDOL) 1.25 MG (50000 UNIT) CAPS capsule Take by mouth.   Past Month   artificial tears (LACRILUBE) OINT ophthalmic ointment Use in left eye and tape shut at night (Patient not taking: Reported on 04/13/2020) 1 g 1    azelastine (ASTELIN) 0.1 % nasal spray Place 1 spray into both nostrils 2 (two) times daily for 7 days. Use in each nostril as directed 30 mL 12    benzonatate (TESSALON) 100 MG capsule Take 1-2 tablets 3 times a day as needed for cough 30 capsule 0    hydroxypropyl methylcellulose / hypromellose (ISOPTO TEARS / GONIOVISC) 2.5 % ophthalmic solution Place 1 drop into the left eye as needed for dry eyes. (Patient not taking: Reported on 04/13/2020) 15 mL 12    hydrOXYzine (ATARAX/VISTARIL) 10 MG tablet Take 1 tablet (10 mg total) by mouth at bedtime as needed and may repeat dose one time if needed. (Patient not taking: Reported on 03/26/2022) 30 tablet 3    loratadine (CLARITIN) 10 MG tablet Take 1 tablet (10 mg total) by mouth daily. (Patient not taking: Reported on 08/10/2022) 30 tablet 6    nirmatrelvir & ritonavir (PAXLOVID, 300/100,) 20 x 150 MG & 10 x 100MG  TBPK Follow  packaging directions for morning and evening doses. (Patient not taking: Reported on 08/10/2022) 30 tablet 0    ondansetron (ZOFRAN) 4 MG tablet Take 1 tablet (4 mg total) by mouth every 6 (six) hours. (Patient not taking: Reported on 03/26/2022) 12 tablet 0    prochlorperazine (COMPAZINE) 10 MG tablet Take 1 tablet (10 mg total) by mouth every 6 (six) hours as needed. 12 tablet 0    traMADol (ULTRAM) 50 MG tablet Take 50 mg by mouth 2 (two) times daily as needed. (Patient not taking: Reported on 03/26/2022)      valACYclovir (VALTREX) 1000 MG tablet Take one pill tid (Patient not taking: Reported on 04/13/2020) 20 tablet 0    varenicline (CHANTIX) 1 MG tablet TOME UNA TABLETA (1 MG TOTAL) POR VIA ORAL DOS VECES AL DIA (Patient not taking: Reported on 08/10/2022)        No Known Allergies   Past Medical History:  Diagnosis Date   Arthritis    knee   GERD (gastroesophageal reflux disease)     Review of systems:  Otherwise negative.    Physical Exam  Gen: Alert, oriented. Appears stated age.  HEENT: PERRLA. Lungs: No respiratory distress CV: RRR Abd: soft, benign, no masses Ext: No edema    Planned procedures: Proceed with colonoscopy. The patient understands the nature of the planned procedure, indications, risks, alternatives and potential complications including but not limited to bleeding, infection, perforation, damage to internal  organs and possible oversedation/side effects from anesthesia. The patient agrees and gives consent to proceed.  Please refer to procedure notes for findings, recommendations and patient disposition/instructions.     Regis Bill, MD [facility]

## 2023-01-28 ENCOUNTER — Encounter: Payer: Self-pay | Admitting: Gastroenterology

## 2023-01-29 LAB — SURGICAL PATHOLOGY

## 2023-02-20 ENCOUNTER — Emergency Department: Payer: Commercial Managed Care - PPO

## 2023-02-20 ENCOUNTER — Other Ambulatory Visit: Payer: Self-pay

## 2023-02-20 DIAGNOSIS — R0789 Other chest pain: Secondary | ICD-10-CM | POA: Diagnosis present

## 2023-02-20 LAB — CBC
HCT: 35.1 % — ABNORMAL LOW (ref 36.0–46.0)
Hemoglobin: 11.7 g/dL — ABNORMAL LOW (ref 12.0–15.0)
MCH: 31.3 pg (ref 26.0–34.0)
MCHC: 33.3 g/dL (ref 30.0–36.0)
MCV: 93.9 fL (ref 80.0–100.0)
Platelets: 274 10*3/uL (ref 150–400)
RBC: 3.74 MIL/uL — ABNORMAL LOW (ref 3.87–5.11)
RDW: 13 % (ref 11.5–15.5)
WBC: 7.8 10*3/uL (ref 4.0–10.5)
nRBC: 0 % (ref 0.0–0.2)

## 2023-02-20 LAB — BASIC METABOLIC PANEL
Anion gap: 7 (ref 5–15)
BUN: 13 mg/dL (ref 8–23)
CO2: 24 mmol/L (ref 22–32)
Calcium: 8.4 mg/dL — ABNORMAL LOW (ref 8.9–10.3)
Chloride: 108 mmol/L (ref 98–111)
Creatinine, Ser: 0.51 mg/dL (ref 0.44–1.00)
GFR, Estimated: >60 mL/min (ref >60)
Glucose, Bld: 99 mg/dL (ref 70–99)
Potassium: 3.3 mmol/L — ABNORMAL LOW (ref 3.5–5.1)
Sodium: 139 mmol/L (ref 135–145)

## 2023-02-20 LAB — TROPONIN I (HIGH SENSITIVITY): Troponin I (High Sensitivity): <2 ng/L (ref <18)

## 2023-02-20 NOTE — ED Triage Notes (Signed)
Pt reports chest pressure that began earlier tonight, pt denies accompanying symptoms, no known cardiac hx. Pt denies cough congestion or fever.

## 2023-02-21 ENCOUNTER — Emergency Department
Admission: EM | Admit: 2023-02-21 | Discharge: 2023-02-21 | Disposition: A | Discharge status: Home / Self Care | Payer: Commercial Managed Care - PPO | Attending: Emergency Medicine | Admitting: Emergency Medicine

## 2023-02-21 DIAGNOSIS — R079 Chest pain, unspecified: Secondary | ICD-10-CM

## 2023-02-21 LAB — TROPONIN I (HIGH SENSITIVITY): Troponin I (High Sensitivity): 3 ng/L (ref <18)

## 2023-02-21 NOTE — ED Notes (Signed)
AVS provided by edp was discussed with pt and pts daughter. Pt was able to verbalize understanding with no additional questions at this time

## 2023-02-21 NOTE — ED Provider Notes (Signed)
[facility] Provider Note    Event Date/Time   First MD Initiated Contact with Patient 02/21/23 [phone]     (approximate)   History   Chest Pain   HPI  Laurie Gonzalez is a 67 y.o. female with history of GERD who presents to the emergency department with central chest pressure that is intermittent over the past day.  No associated shortness of breath, nausea, vomiting, diaphoresis, dizziness, fever or cough.  She does feel like it is worse with exertion.  No history of PE, DVT, exogenous estrogen use, recent fractures, surgery, trauma, hospitalization, prolonged travel or other immobilization. No lower extremity swelling or pain. No calf tenderness.  Not having any pain currently.  No history of hypertension, diabetes, hyperlipidemia, tobacco use.  She does not have a cardiologist.   History provided by patient, family.    Past Medical History:  Diagnosis Date   Arthritis    knee   GERD (gastroesophageal reflux disease)     Past Surgical History:  Procedure Laterality Date   ABDOMINAL HYSTERECTOMY     COLONOSCOPY WITH PROPOFOL N/A 05/13/2015   Procedure: COLONOSCOPY WITH PROPOFOL;  Surgeon: Scot Jun, MD;  Location: [facility] ENDOSCOPY;  Service: Endoscopy;  Laterality: N/A;   COLONOSCOPY WITH PROPOFOL N/A 08/10/2022   Procedure: COLONOSCOPY WITH PROPOFOL;  Surgeon: Regis Bill, MD;  Location: [facility] ENDOSCOPY;  Service: Endoscopy;  Laterality: N/A;  SPANISH Carmelina Noun [phone] WITH TIME   COLONOSCOPY WITH PROPOFOL N/A 01/25/2023   Procedure: COLONOSCOPY WITH PROPOFOL;  Surgeon: Regis Bill, MD;  Location: [facility] ENDOSCOPY;  Service: Endoscopy;  Laterality: N/A;   ESOPHAGOGASTRODUODENOSCOPY (EGD) WITH PROPOFOL N/A 05/13/2015   Procedure: ESOPHAGOGASTRODUODENOSCOPY (EGD) WITH PROPOFOL;  Surgeon: Scot Jun, MD;  Location: [facility] ENDOSCOPY;  Service: Endoscopy;  Laterality: N/A;   KNEE ARTHROSCOPY     KNEE ARTHROSCOPY Right  06/26/2017   Procedure: ARTHROSCOPY KNEE WITH DEBRIDEMENT AND REPAIR VS PARTIAL MEDIAL MENISCECTOMY;  Surgeon: Christena Flake, MD;  Location: [facility];  Service: Orthopedics;  Laterality: Right;   neck pain Left    "hit by a ball at a game and took Prednisone and better now"   POLYPECTOMY  01/25/2023   Procedure: POLYPECTOMY;  Surgeon: Regis Bill, MD;  Location: [facility] ENDOSCOPY;  Service: Endoscopy;;    MEDICATIONS:  Prior to Admission medications   Medication Sig Start Date End Date Taking? Authorizing Provider  albuterol (VENTOLIN HFA) 108 (90 Base) MCG/ACT inhaler Inhale into the lungs.    [provider]  artificial tears (LACRILUBE) OINT ophthalmic ointment Use in left eye and tape shut at night Patient not taking: Reported on 04/13/2020 01/03/19   Bethann Berkshire, MD  azelastine (ASTELIN) 0.1 % nasal spray Place 1 spray into both nostrils 2 (two) times daily for 7 days. Use in each nostril as directed 03/26/22 04/02/22  Immordino, Jeannett Senior, FNP  benzonatate (TESSALON) 100 MG capsule Take 1-2 tablets 3 times a day as needed for cough 03/26/22   Immordino, Jeannett Senior, FNP  hydroxypropyl methylcellulose / hypromellose (ISOPTO TEARS / GONIOVISC) 2.5 % ophthalmic solution Place 1 drop into the left eye as needed for dry eyes. Patient not taking: Reported on 04/13/2020 01/03/19   Bethann Berkshire, MD  hydrOXYzine (ATARAX/VISTARIL) 10 MG tablet Take 1 tablet (10 mg total) by mouth at bedtime as needed and may repeat dose one time if needed. Patient not taking: Reported on 03/26/2022 04/13/20   Georgiana Spinner, NP  loratadine (CLARITIN) 10  MG tablet Take 1 tablet (10 mg total) by mouth daily. Patient not taking: Reported on 08/10/2022 04/13/20   Georgiana Spinner, NP  nirmatrelvir & ritonavir (PAXLOVID, 300/100,) 20 x 150 MG & 10 x 100MG  TBPK Follow packaging directions for morning and evening doses. Patient not taking: Reported on 08/10/2022 03/26/22   Immordino, Jeannett Senior, FNP  ondansetron  (ZOFRAN) 4 MG tablet Take 1 tablet (4 mg total) by mouth every 6 (six) hours. Patient not taking: Reported on 03/26/2022 01/25/22   Immordino, Jeannett Senior, FNP  prochlorperazine (COMPAZINE) 10 MG tablet Take 1 tablet (10 mg total) by mouth every 6 (six) hours as needed. 11/27/22   Chesley Noon, MD  traMADol (ULTRAM) 50 MG tablet Take 50 mg by mouth 2 (two) times daily as needed. Patient not taking: Reported on 03/26/2022 08/09/21   [provider]  valACYclovir (VALTREX) 1000 MG tablet Take one pill tid Patient not taking: Reported on 04/13/2020 01/03/19   Bethann Berkshire, MD  varenicline (CHANTIX) 1 MG tablet TOME UNA TABLETA (1 MG TOTAL) POR VIA ORAL DOS VECES AL DIA Patient not taking: Reported on 08/10/2022 12/13/21   [provider]  Vitamin D, Ergocalciferol, (DRISDOL) 1.25 MG (50000 UNIT) CAPS capsule Take by mouth. 11/16/21   [provider]    Physical Exam   Triage Vital Signs: ED Triage Vitals  Encounter Vitals Group     BP 02/20/23 1929 (!) 142/84     Systolic BP Percentile --      Diastolic BP Percentile --      Pulse Rate 02/20/23 1929 87     Resp 02/20/23 1929 20     Temp 02/20/23 1929 97.8 F (36.6 C)     Temp Source 02/20/23 1929 Oral     SpO2 02/20/23 1929 96 %     Weight 02/20/23 1928 190 lb (86.2 kg)     Height 02/20/23 1928 5\' 2"  (1.575 m)     Head Circumference --      Peak Flow --      Pain Score 02/20/23 1928 8     Pain Loc --      Pain Education --      Exclude from Growth Chart --     Most recent vital signs: Vitals:   02/21/23 0325 02/21/23 0330  BP:    Pulse: 74 69  Resp:    Temp:    SpO2: 100% 100%    CONSTITUTIONAL: Alert, responds appropriately to questions. Well-appearing; well-nourished HEAD: Normocephalic, atraumatic EYES: Conjunctivae clear, pupils appear equal, sclera nonicteric ENT: normal nose; moist mucous membranes NECK: Supple, normal ROM CARD: RRR; S1 and S2 appreciated RESP: Normal chest excursion without  splinting or tachypnea; breath sounds clear and equal bilaterally; no wheezes, no rhonchi, no rales, no hypoxia or respiratory distress, speaking full sentences ABD/GI: Non-distended; soft, non-tender, no rebound, no guarding, no peritoneal signs BACK: The back appears normal EXT: Normal ROM in all joints; no deformity noted, no edema, no calf tenderness or calf swelling SKIN: Normal color for age and race; warm; no rash on exposed skin NEURO: Moves all extremities equally, normal speech PSYCH: The patient's mood and manner are appropriate.   ED Results / Procedures / Treatments   LABS: (all labs ordered are listed, but only abnormal results are displayed) Labs Reviewed  BASIC METABOLIC PANEL - Abnormal; Notable for the following components:      Result Value   Potassium 3.3 (*)    Calcium 8.4 (*)    All  other components within normal limits  CBC - Abnormal; Notable for the following components:   RBC 3.74 (*)    Hemoglobin 11.7 (*)    HCT 35.1 (*)    All other components within normal limits  TROPONIN I (HIGH SENSITIVITY)  TROPONIN I (HIGH SENSITIVITY)     EKG:  EKG Interpretation Date/Time:  Wednesday February 20 2023 19:30:34 EST Ventricular Rate:  87 PR Interval:  148 QRS Duration:  126 QT Interval:  394 QTC Calculation: 474 R Axis:   200  Text Interpretation: Sinus rhythm with occasional Premature ventricular complexes Right bundle branch block Abnormal ECG When compared with ECG of 27-Nov-2022 07:35, Premature ventricular complexes are now Present Confirmed by Rochele Raring [phone]) on 02/21/2023 3:27:27 AM         RADIOLOGY: My personal review and interpretation of imaging: Chest x-ray clear.  I have personally reviewed all radiology reports.   DG Chest 2 View Result Date: 02/20/2023 CLINICAL DATA:  Chest pain and pressure. EXAM: CHEST - 2 VIEW COMPARISON:  Chest CT with contrast 08/03/2021 FINDINGS: The heart size and mediastinal contours are within normal  limits. Both lungs are clear. There is mild osteopenia with degenerative changes of the thoracic spine and mild kyphosis. IMPRESSION: No evidence of acute chest disease. Osteopenia and degenerative changes. Electronically Signed   By: Almira Bar M.D.   On: 02/20/2023 20:34     PROCEDURES:  Critical Care performed: No    Procedures    IMPRESSION / MDM / ASSESSMENT AND PLAN / ED COURSE  I reviewed the triage vital signs and the nursing notes.    Patient here with exertional chest pain.  Currently asymptomatic.     DIFFERENTIAL DIAGNOSIS (includes but not limited to):   ACS, PE, chest wall pain, GERD, esophagitis, esophageal spasm, less likely dissection, pneumonia, pneumothorax, CHF   Patient's presentation is most consistent with acute presentation with potential threat to life or bodily function.   PLAN: Patient's labs are unremarkable.  Normal hemoglobin, electrolytes.  Troponin x 2 negative.  EKG nonischemic.  Chest x-ray reviewed and interpreted by myself and the radiologist and shows no pneumonia, pneumothorax, widened mediastinum or cardiomegaly.  Given patient's age and complaints of exertional chest pain, will referral to cardiology for further workup.  Patient asymptomatic currently, hemodynamically stable.  She is comfortable with this plan.   MEDICATIONS GIVEN IN ED: Medications - No data to display   ED COURSE:  At this time, I do not feel there is any life-threatening condition present. I reviewed all nursing notes, vitals, pertinent previous records.  All lab and urine results, EKGs, imaging ordered have been independently reviewed and interpreted by myself.  I reviewed all available radiology reports from any imaging ordered this visit.  Based on my assessment, I feel the patient is safe to be discharged home without further emergent workup and can continue workup as an outpatient as needed. Discussed all findings, treatment plan as well as usual and customary  return precautions.  They verbalize understanding and are comfortable with this plan.  Outpatient follow-up has been provided as needed.  All questions have been answered.    CONSULTS:  none   OUTSIDE RECORDS REVIEWED: Reviewed last internal medicine note on 01/10/2023.       FINAL CLINICAL IMPRESSION(S) / ED DIAGNOSES   Final diagnoses:  Nonspecific chest pain     Rx / DC Orders   ED Discharge Orders          Ordered  Ambulatory referral to Cardiology       Comments: If you have not heard from the Cardiology office within the next 72 hours please call [phone].   02/21/23 0326             Note:  This document was prepared using Dragon voice recognition software and may include unintentional dictation errors.   Vada Swift, Layla Maw, DO 02/21/23 [phone]

## 2023-05-22 ENCOUNTER — Ambulatory Visit
Admission: RE | Admit: 2023-05-22 | Discharge: 2023-05-22 | Disposition: A | Discharge status: Home / Self Care | Source: Ambulatory Visit | Attending: Internal Medicine | Admitting: Internal Medicine

## 2023-05-22 DIAGNOSIS — Z1231 Encounter for screening mammogram for malignant neoplasm of breast: Secondary | ICD-10-CM | POA: Insufficient documentation

## 2023-08-07 ENCOUNTER — Other Ambulatory Visit: Payer: Self-pay | Admitting: Physician Assistant

## 2023-08-07 DIAGNOSIS — I5189 Other ill-defined heart diseases: Secondary | ICD-10-CM

## 2023-10-16 ENCOUNTER — Ambulatory Visit
Admission: RE | Admit: 2023-10-16 | Discharge: 2023-10-16 | Disposition: A | Discharge status: Home / Self Care | Payer: Self-pay | Source: Ambulatory Visit | Attending: Physician Assistant | Admitting: Physician Assistant

## 2023-10-16 ENCOUNTER — Other Ambulatory Visit: Payer: Self-pay | Admitting: Physician Assistant

## 2023-10-16 ENCOUNTER — Ambulatory Visit

## 2023-10-16 DIAGNOSIS — I5189 Other ill-defined heart diseases: Secondary | ICD-10-CM | POA: Diagnosis present

## 2023-10-16 MED ORDER — GADOBUTROL 1 MMOL/ML IV SOLN
11.0000 mL | Freq: Once | INTRAVENOUS | Status: AC | PRN
  Administered 2023-10-16: 11 mL via INTRAVENOUS

## 2024-02-29 ENCOUNTER — Encounter: Payer: Self-pay | Admitting: Emergency Medicine

## 2024-02-29 ENCOUNTER — Ambulatory Visit
Admission: EM | Admit: 2024-02-29 | Discharge: 2024-02-29 | Disposition: A | Discharge status: Home / Self Care | Attending: Emergency Medicine | Admitting: Emergency Medicine

## 2024-02-29 DIAGNOSIS — J209 Acute bronchitis, unspecified: Secondary | ICD-10-CM | POA: Diagnosis not present

## 2024-02-29 LAB — POC COVID19/FLU A&B COMBO
Covid Antigen, POC: NEGATIVE
Influenza A Antigen, POC: NEGATIVE
Influenza B Antigen, POC: NEGATIVE

## 2024-02-29 MED ORDER — BENZONATATE 100 MG PO CAPS: 100.0000 mg | ORAL_CAPSULE | Freq: Three times a day (TID) | ORAL | 0 refills | Status: AC

## 2024-02-29 MED ORDER — PROMETHAZINE-DM 6.25-15 MG/5ML PO SYRP: 5.0000 mL | ORAL_SOLUTION | Freq: Every evening | ORAL | 0 refills | Status: AC | PRN

## 2024-02-29 MED ORDER — PREDNISONE 10 MG (21) PO TBPK: ORAL_TABLET | Freq: Every day | ORAL | 0 refills | Status: AC

## 2024-02-29 NOTE — Discharge Instructions (Addendum)
 Your symptoms today are most likely being caused by a virus and should steadily improve in time it can take up to 7 to 10 days before you truly start to see a turnaround however things will get better, Do believe there is inflammation to the airway making it difficult for you to breathe  COVID and flu test negative  Begin prednisone  every morning with food to open and relax the airway, avoid ibuprofen  while taking you may use Tylenol   May use Tessalon  pill every 8 hours as needed for cough May use cough syrup at bedtime to allow for    You can take Tylenol  and/or Ibuprofen  as needed for fever reduction and pain relief.   For cough: honey 1/2 to 1 teaspoon (you can dilute the honey in water or another fluid).  You can also use guaifenesin and dextromethorphan for cough. You can use a humidifier for chest congestion and cough.  If you don't have a humidifier, you can sit in the bathroom with the hot shower running.      For sore throat: try warm salt water gargles, cepacol lozenges, throat spray, warm tea or water with lemon/honey, popsicles or ice, or OTC cold relief medicine for throat discomfort.   For congestion: take a daily anti-histamine like Zyrtec, Claritin , and a oral decongestant, such as pseudoephedrine.  You can also use Flonase 1-2 sprays in each nostril daily.   It is important to stay hydrated: drink plenty of fluids (water, gatorade/powerade/pedialyte, juices, or teas) to keep your throat moisturized and help further relieve irritation/discomfort.

## 2024-02-29 NOTE — ED Provider Notes (Signed)
 " CAY RALPH PELT    CSN: [id number] Arrival date & time: 02/29/24  1343      History   Chief Complaint Chief Complaint  Patient presents with   Cough   Wheezing   Generalized Body Aches    HPI Laurie Gonzalez is a 68 y.o. female.   Patient presents for evaluation of subjective fever, nasal congestion, sore throat, sinus pressure to the bridge of the nose, productive cough, shortness of breath at rest and intermittent wheezing beginning 1 day ago.  Symptoms interfering with sleep.  No known sick contacts prior.  Has attempted use of TheraFlu and Tylenol .  Denies respiratory history, current smoker.   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   Atrial mass 05/19/2018   Lumbar radiculopathy 08/06/2017   Chronic left-sided low back pain with left-sided sciatica 08/06/2017   Complex tear of lateral meniscus of right knee as current injury 06/27/2017   Complex tear of medial meniscus of right knee as current injury 09/20/2016   Primary osteoarthritis of right knee 09/20/2016   Current every day smoker 01/21/2015   Obesity, Class II, BMI 35-39.9 01/21/2015    Past Surgical History:  Procedure Laterality Date   ABDOMINAL HYSTERECTOMY     COLONOSCOPY WITH PROPOFOL  N/A 05/13/2015   Procedure: COLONOSCOPY WITH PROPOFOL ;  Surgeon: Lamar ONEIDA Holmes, MD;  Location: [facility] ENDOSCOPY;  Service: Endoscopy;  Laterality: N/A;   COLONOSCOPY WITH PROPOFOL  N/A 08/10/2022   Procedure: COLONOSCOPY WITH PROPOFOL ;  Surgeon: Maryruth Ole ONEIDA, MD;  Location: [facility] ENDOSCOPY;  Service: Endoscopy;  Laterality: N/AMERL ERVIN GLENWOOD GWENITH DEANIE ARNULFO [phone] WITH TIME   COLONOSCOPY WITH PROPOFOL  N/A 01/25/2023   Procedure: COLONOSCOPY WITH PROPOFOL ;  Surgeon: Maryruth Ole ONEIDA, MD;  Location: [facility] ENDOSCOPY;   Service: Endoscopy;  Laterality: N/A;   ESOPHAGOGASTRODUODENOSCOPY (EGD) WITH PROPOFOL  N/A 05/13/2015   Procedure: ESOPHAGOGASTRODUODENOSCOPY (EGD) WITH PROPOFOL ;  Surgeon: Lamar ONEIDA Holmes, MD;  Location: [facility] ENDOSCOPY;  Service: Endoscopy;  Laterality: N/A;   KNEE ARTHROSCOPY     KNEE ARTHROSCOPY Right 06/26/2017   Procedure: ARTHROSCOPY KNEE WITH DEBRIDEMENT AND REPAIR VS PARTIAL MEDIAL MENISCECTOMY;  Surgeon: Edie Norleen PARAS, MD;  Location: [facility];  Service: Orthopedics;  Laterality: Right;   neck pain Left    hit by a ball at a game and took Prednisone  and better now   POLYPECTOMY  01/25/2023   Procedure: POLYPECTOMY;  Surgeon: Maryruth Ole ONEIDA, MD;  Location: [facility] ENDOSCOPY;  Service: Endoscopy;;    OB History   No obstetric history on file.      Home Medications    Prior to Admission medications  Medication Sig Start Date End Date Taking? Authorizing Provider  albuterol  (VENTOLIN  HFA) 108 (90 Base) MCG/ACT inhaler Inhale into the lungs.    [provider]  artificial tears (LACRILUBE) OINT ophthalmic ointment Use in left eye and tape shut at night Patient not taking: Reported on 04/13/2020 01/03/19   Zammit, Joseph, MD  azelastine  (ASTELIN ) 0.1 % nasal spray Place 1 spray into both nostrils 2 (two) times daily for 7 days. Use in each nostril as directed 03/26/22 04/02/22  Immordino, Garnette, FNP  benzonatate  (TESSALON ) 100 MG capsule Take 1-2 tablets 3 times a day as needed for cough 03/26/22   Immordino, Garnette, FNP  hydroxypropyl methylcellulose / hypromellose (ISOPTO TEARS / GONIOVISC) 2.5 % ophthalmic solution Place 1 drop into the left eye as needed for dry eyes. Patient not taking: Reported on 04/13/2020 01/03/19   Zammit, Joseph, MD  hydrOXYzine  (ATARAX /VISTARIL ) 10 MG tablet Take 1 tablet (10 mg total) by mouth at bedtime as needed and may repeat dose one time if needed. Patient not taking: Reported on 03/26/2022 04/13/20   Brown, Fallon E, NP   loratadine  (CLARITIN ) 10 MG tablet Take 1 tablet (10 mg total) by mouth daily. Patient not taking: Reported on 08/10/2022 04/13/20   Brown, Fallon E, NP  nirmatrelvir & ritonavir  (PAXLOVID , 300/100,) 20 x 150 MG & 10 x 100MG  TBPK Follow packaging directions for morning and evening doses. Patient not taking: Reported on 08/10/2022 03/26/22   Immordino, Garnette, FNP  ondansetron  (ZOFRAN ) 4 MG tablet Take 1 tablet (4 mg total) by mouth every 6 (six) hours. Patient not taking: Reported on 03/26/2022 01/25/22   Immordino, Garnette, FNP  prochlorperazine  (COMPAZINE ) 10 MG tablet Take 1 tablet (10 mg total) by mouth every 6 (six) hours as needed. 11/27/22   Willo Dunnings, MD  traMADol (ULTRAM) 50 MG tablet Take 50 mg by mouth 2 (two) times daily as needed. Patient not taking: Reported on 03/26/2022 08/09/21   [provider]  valACYclovir  (VALTREX ) 1000 MG tablet Take one pill tid Patient not taking: Reported on 04/13/2020 01/03/19   Zammit, Joseph, MD  varenicline (CHANTIX) 1 MG tablet TOME UNA TABLETA (1 MG TOTAL) POR VIA ORAL DOS VECES AL DIA Patient not taking: Reported on 08/10/2022 12/13/21   [provider]  Vitamin D, Ergocalciferol, (DRISDOL) 1.25 MG (50000 UNIT) CAPS capsule Take by mouth. 11/16/21   [provider]    Family History Family History  Problem Relation Age of Onset   Breast cancer Neg Hx     Social History Social History[1]   Allergies   Patient has no known allergies.   Review of Systems Review of Systems  Constitutional:  Positive for fever. Negative for activity change, appetite change, chills, diaphoresis, fatigue and unexpected weight change.  HENT:  Positive for congestion, sinus pain and sore throat. Negative for dental problem, drooling, ear discharge, ear pain, facial swelling, hearing loss, mouth sores, nosebleeds, postnasal drip, rhinorrhea, sinus pressure, sneezing, tinnitus, trouble swallowing and voice change.   Respiratory:  Positive  for cough, shortness of breath and wheezing. Negative for apnea, choking, chest tightness and stridor.   Neurological:  Positive for headaches. Negative for dizziness, tremors, seizures, syncope, facial asymmetry, speech difficulty, weakness, light-headedness and numbness.     Physical Exam Triage Vital Signs ED Triage Vitals  Encounter Vitals Group     BP 02/29/24 1426 135/79     Girls Systolic BP Percentile --      Girls Diastolic BP Percentile --      Boys Systolic BP Percentile --      Boys Diastolic BP Percentile --      Pulse Rate 02/29/24 1426 80     Resp 02/29/24 1426 20     Temp 02/29/24 1426 98.3 F (36.8 C)     Temp Source 02/29/24 1426 Oral     SpO2 02/29/24 1426 97 %     Weight --      Height --      Head Circumference --      Peak Flow --      Pain Score 02/29/24 1429 8     Pain Loc --  Pain Education --      Exclude from Growth Chart --    No data found.  Updated Vital Signs BP 135/79 (BP Location: Right Arm)   Pulse 80   Temp 98.3 F (36.8 C) (Oral)   Resp 20   SpO2 97%   Visual Acuity Right Eye Distance:   Left Eye Distance:   Bilateral Distance:    Right Eye Near:   Left Eye Near:    Bilateral Near:     Physical Exam Constitutional:      Appearance: Normal appearance.  HENT:     Head: Normocephalic.     Right Ear: Tympanic membrane, ear canal and external ear normal.     Left Ear: Tympanic membrane, ear canal and external ear normal.     Nose: Congestion present.     Mouth/Throat:     Pharynx: No oropharyngeal exudate or posterior oropharyngeal erythema.  Cardiovascular:     Rate and Rhythm: Normal rate and regular rhythm.     Pulses: Normal pulses.     Heart sounds: Normal heart sounds.  Pulmonary:     Effort: Pulmonary effort is normal.     Breath sounds: Normal breath sounds.  Musculoskeletal:     Cervical back: Normal range of motion and neck supple.  Skin:    General: Skin is warm and dry.  Neurological:     Mental  Status: She is alert and oriented to person, place, and time. Mental status is at baseline.      UC Treatments / Results  Labs (all labs ordered are listed, but only abnormal results are displayed) Labs Reviewed  POC COVID19/FLU A&B COMBO    EKG   Radiology No results found.  Procedures Procedures (including critical care time)  Medications Ordered in UC Medications - No data to display  Initial Impression / Assessment and Plan / UC Course  I have reviewed the triage vital signs and the nursing notes.  Pertinent labs & imaging results that were available during my care of the patient were reviewed by me and considered in my medical decision making (see chart for details).  Acute bronchitis  Patient is in no signs of distress nor toxic appearing.  Vital signs are stable.  Low suspicion for pneumonia therefore will defer imaging.  COVID and flu testing negative.  Prescribed prednisone , Tessalon  and Promethazine  DM. May use additional over-the-counter medications as needed for supportive care.  May follow-up with urgent care as needed if symptoms persist or worsen.  Note given.   Final Clinical Impressions(s) / UC Diagnoses   Final diagnoses:  Viral illness   Discharge Instructions   None    ED Prescriptions   None    PDMP not reviewed this encounter.     [1]  Social History Tobacco Use   Smoking status: Every Day    Current packs/day: 0.25    Average packs/day: 0.3 packs/day for 43.0 years (10.8 ttl pk-yrs)    Types: Cigarettes   Smokeless tobacco: Never   Tobacco comments:    since age 83  Vaping Use   Vaping status: Never Used  Substance Use Topics   Alcohol use: Yes    Comment: Holidays   Drug use: No     Teresa Shelba SAUNDERS, NP 03/01/24 [phone]  "

## 2024-02-29 NOTE — ED Triage Notes (Signed)
 Patient reports cough with yellow-green mucus, wheezing and body aches x 2 days. Patient has taken There-Flu and Tylenol  with no relief. Rates pain 8/10.
